# Patient Record
Sex: Female | Born: 1990 | Race: Black or African American | Hispanic: No | Marital: Married | Smoking: Never smoker
Health system: Southern US, Community
[De-identification: ages and names within clinical notes are randomized; demographics above are authoritative.]

## PROBLEM LIST (undated history)

## (undated) DIAGNOSIS — B951 Streptococcus, group B, as the cause of diseases classified elsewhere: Secondary | ICD-10-CM

## (undated) HISTORY — PX: BREAST BIOPSY: SHX20

---

## 2015-11-06 DIAGNOSIS — D242 Benign neoplasm of left breast: Secondary | ICD-10-CM

## 2015-11-06 HISTORY — DX: Benign neoplasm of left breast: D24.2

## 2015-11-29 ENCOUNTER — Other Ambulatory Visit: Payer: Self-pay | Admitting: Obstetrics and Gynecology

## 2015-11-29 DIAGNOSIS — N63 Unspecified lump in unspecified breast: Secondary | ICD-10-CM

## 2015-12-02 ENCOUNTER — Other Ambulatory Visit: Payer: Self-pay | Admitting: Obstetrics and Gynecology

## 2015-12-02 DIAGNOSIS — N63 Unspecified lump in unspecified breast: Secondary | ICD-10-CM

## 2015-12-05 ENCOUNTER — Ambulatory Visit
Admission: RE | Admit: 2015-12-05 | Discharge: 2015-12-05 | Disposition: A | Discharge status: Home / Self Care | Payer: 59 | Source: Ambulatory Visit | Attending: Obstetrics and Gynecology | Admitting: Obstetrics and Gynecology

## 2015-12-05 DIAGNOSIS — N63 Unspecified lump in unspecified breast: Secondary | ICD-10-CM

## 2016-07-08 ENCOUNTER — Other Ambulatory Visit: Payer: Self-pay | Admitting: Obstetrics and Gynecology

## 2016-07-08 DIAGNOSIS — N63 Unspecified lump in unspecified breast: Secondary | ICD-10-CM

## 2016-08-04 ENCOUNTER — Ambulatory Visit: Payer: 59

## 2016-08-04 ENCOUNTER — Other Ambulatory Visit: Payer: 59

## 2016-10-21 ENCOUNTER — Encounter: Payer: Self-pay | Admitting: Obstetrics and Gynecology

## 2016-11-10 ENCOUNTER — Telehealth: Payer: Self-pay

## 2016-11-10 NOTE — Telephone Encounter (Signed)
Lm with pt regarding breast follow up per [facility]. Please let me know when she calls back

## 2016-11-10 NOTE — Telephone Encounter (Signed)
-----   Message from Conard NovakStephen D Jackson, MD sent at 11/10/2016  9:45 AM EST ----- Regarding: [place], This is a [place] that needed a [place].  [place].  But, I have not been in touch with her.  Would you mind calling this patient and making sure she has had a breast biopsy or at least has had one arranged?  Thank you

## 2016-11-24 ENCOUNTER — Encounter (INDEPENDENT_AMBULATORY_CARE_PROVIDER_SITE_OTHER): Payer: Self-pay | Admitting: Family Medicine

## 2016-11-24 ENCOUNTER — Encounter (INDEPENDENT_AMBULATORY_CARE_PROVIDER_SITE_OTHER): Payer: Self-pay

## 2016-11-24 ENCOUNTER — Ambulatory Visit (INDEPENDENT_AMBULATORY_CARE_PROVIDER_SITE_OTHER): Payer: No Typology Code available for payment source | Admitting: Family Medicine

## 2016-11-24 VITALS — BP 113/69 | HR 77 | Temp 97.8°F | Ht 66.75 in | Wt 130.6 lb

## 2016-11-24 DIAGNOSIS — R110 Nausea: Secondary | ICD-10-CM

## 2016-11-24 DIAGNOSIS — R42 Dizziness and giddiness: Secondary | ICD-10-CM

## 2016-11-24 DIAGNOSIS — N63 Unspecified lump in unspecified breast: Secondary | ICD-10-CM

## 2016-11-24 NOTE — Progress Notes (Signed)
Subjective:      Patient ID: [name] is a 26 y.o. female.    Chief Complaint:  Chief Complaint   Patient presents with   . Breast Mass     order for biopsy   . Dizziness     c/o started last week with some nausea.        HPI:  HPI     Last Feb she had an annual check up with breast exam, pap. Found to be abn. Was referred for US breast. Was told to f/u q6 months. That was in [place]. She had a mammo f/u in Feb, noticed L breast lump was enlarged. Was told to have biopsy.     She has some intermittent dizziness siunce last week.It only last for a few seconds.  Last time she had it was in October or November.  When she lay down. Unsure if ear infection. Sometimes happens after she is sitting for some time. She was on a Flight last week and heard a pop in her ears with some pain. No congestion or allergies. Not positional. Sometimes has 12 oz caffeine, sometimes none. No syncope. No CP or palpitations. No dyspnea on exertion. She is not too active. Does not drink a lot of water a day.     Sometimes has nausea with exercise. This has not always been the case.  When she was exercising more regularly, she did not have this problem until one day came on suddenly.  Due to this, she has been avoiding exercise.  She has tried to eat before and after exercise and has not found good relief with either method. Periods are regular.  She does not describe a feeling of bloating.  There is no family history of gynecologic cancers.      Problem List:  There is no problem list on file for this patient.      Current Medications:  No current outpatient prescriptions on file.     No current facility-administered medications for this visit.        Allergies:  No Known Allergies    Past Medical History:  History reviewed. No pertinent past medical history.    Past Surgical History:  History reviewed. No pertinent surgical history.    Family History:  Family History   Problem Relation Age of Onset   . No known problems Mother    .  Cancer Father      lung cancer, smoker   . No known problems Sister    . No known problems Brother    . No known problems Sister    . No known problems Sister    . No known problems Sister        Social History:  Social History     Social History   . Marital status: Single     Spouse name: N/A   . Number of children: N/A   . Years of education: N/A     Occupational History   . auditor      Social History Main Topics   . Smoking status: Never Smoker   . Smokeless tobacco: Never Used   . Alcohol use No   . Drug use: No   . Sexual activity: Yes     Partners: Male     Other Topics Concern   . Not on file     Social History Narrative   . No narrative on file       The following sections  were reviewed this encounter by the provider:   Tobacco  Allergies  Meds  Problems  Med Hx  Surg Hx  Fam Hx  Soc Hx          ROS:  Review of Systems   No changes in bowel habits or urinary problems.     Vitals:  BP 113/69 (BP Site: Left arm, Patient Position: Sitting, Cuff Size: Medium)   Pulse 77   Temp 97.8 F (36.6 C) (Oral)   Ht 1.695 m (5' 6.75")   Wt 59.2 kg (130 lb 9.6 oz)   LMP 10/22/2016 (Exact Date)   SpO2 98%   BMI 20.61 kg/m      Objective:     Physical Exam:  Physical Exam   Constitutional: She is oriented to person, place, and time. She appears well-developed and well-nourished.   HENT:   Head: Normocephalic and atraumatic.   Right Ear: Hearing, tympanic membrane, external ear and ear canal normal.   Left Ear: Hearing, tympanic membrane, external ear and ear canal normal.   Mouth/Throat: Oropharynx is clear and moist. No oropharyngeal exudate.   Cardiovascular: Normal rate and regular rhythm.  Exam reveals no friction rub.    No murmur heard.  Pulmonary/Chest: Effort normal and breath sounds normal. No respiratory distress. She has no wheezes.   Neurological: She is alert and oriented to person, place, and time.   Negative Dix-Hallpike.   Skin: Skin is warm and dry.   Psychiatric: She has a normal mood and  affect. Her behavior is normal.   Nursing note and vitals reviewed.         Labs:  No results found for: WBC, HGB, HCT, PLT, CHOL, TRIG, HDL, LDL, ALT, AST, NA, K, CL, CREAT, BUN, CO2, TSH, PSA, INR, GLU, HGBA1C       Assessment:     1. Breast mass  - Breast Surgery Referral: Anner Crete, MD [facility])    2. Dizziness    3. Nausea      Plan:     Patient Instructions   Talked about many things today.    1.  We talked about the referral to the breast surgeon.  If you decides that she would like to see a doctor in [place], because this is closer to your workplace, let me know and I will give you a generic referral, that you can take anywhere.    2.  We talked about your nausea with exercise.  This might be due to dehydration or deconditioning.  Keep an eye on this and let me know if you noticed any other changes.  We also discussed if you were having some increased bloating, we may consider sending you for a pelvic ultrasound to make sure that your reproductive system is normal.    3.  We talked about your dizziness.  It does not seem that this is very consistent frequent or long lasting.  I do not know exactly what caused your brief episode of dizziness in the past, but if you notice that it is becoming more troublesome, then please see if you can find any triggers and return for follow-up appointment.      Terressa Koyanagi, MD

## 2016-11-24 NOTE — Patient Instructions (Signed)
Talked about many things today.    1.  We talked about the referral to the breast surgeon.  If you decides that she would like to see a doctor in [place], because this is closer to your workplace, let me know and I will give you a generic referral, that you can take anywhere.    2.  We talked about your nausea with exercise.  This might be due to dehydration or deconditioning.  Keep an eye on this and let me know if you noticed any other changes.  We also discussed if you were having some increased bloating, we may consider sending you for a pelvic ultrasound to make sure that your reproductive system is normal.    3.  We talked about your dizziness.  It does not seem that this is very consistent frequent or long lasting.  I do not know exactly what caused your brief episode of dizziness in the past, but if you notice that it is becoming more troublesome, then please see if you can find any triggers and return for follow-up appointment.

## 2016-11-24 NOTE — Progress Notes (Signed)
Have you seen any specialists/other providers since your last visit with us?    No      Limb alert protocol reviewed?      Yes

## 2016-11-30 NOTE — Telephone Encounter (Signed)
Fax received From [facility] dated 11/25/16. They are requesting us to provide them with a copy of the breast biopsy Pathology report for this patient.

## 2016-11-30 NOTE — Telephone Encounter (Signed)
Trying to call pt again regarding this. Cannot get in touch with her. Please let me know when she calls back or find out if she has had the biopsy or if she has it scheduled and let me know please. Thank you

## 2016-12-11 NOTE — Telephone Encounter (Signed)
Yes. Please send patient a letter letting her know we are trying to contact her and have been unsuccessful.  We want her to know that it is important to follow up with all breast-related testing we have been doing.  If she has been doing this, we would appreciate a phone call just to let us know.  Please let me know, if you have questions.

## 2016-12-17 NOTE — Telephone Encounter (Signed)
We do not have a new address for the patient. Lmtrc.

## 2016-12-18 NOTE — Telephone Encounter (Signed)
I was able to reach the patient. She hasn't had a biopsy yet, but plans to schedule it within the next couple of weeks. She doesn't plan to have the biopsy at [facility], but said she has a primary care provider in the area and will have that provider place orders for the biopsy. I repeated that information back to the patient, and then confirmed with the patient that she did not need anything further from Dr Jean Rosenthal, and she said that was correct.

## 2016-12-22 ENCOUNTER — Encounter: Payer: Self-pay | Admitting: Obstetrics and Gynecology

## 2017-01-10 IMAGING — US US BREAST*R* LIMITED INC AXILLA
2 series · 11 of 11 positions shown · non-contrast
Comparison: None.

CLINICAL DATA: 24-year-old female who has noticed a nodule in the
upper, outer right breast and lateral left breast for 2 weeks.

EXAM:
ULTRASOUND OF THE BILATERAL BREAST

[Series 2: us breast*right* limited inc axilla · 0.05mm/px · 6 of 6 slices shown (1 of 2)]
[im 1/6]
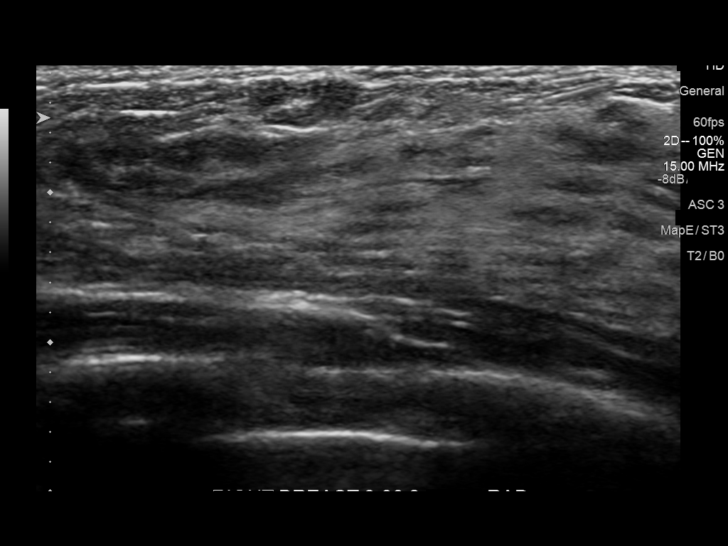
[im 2/6]
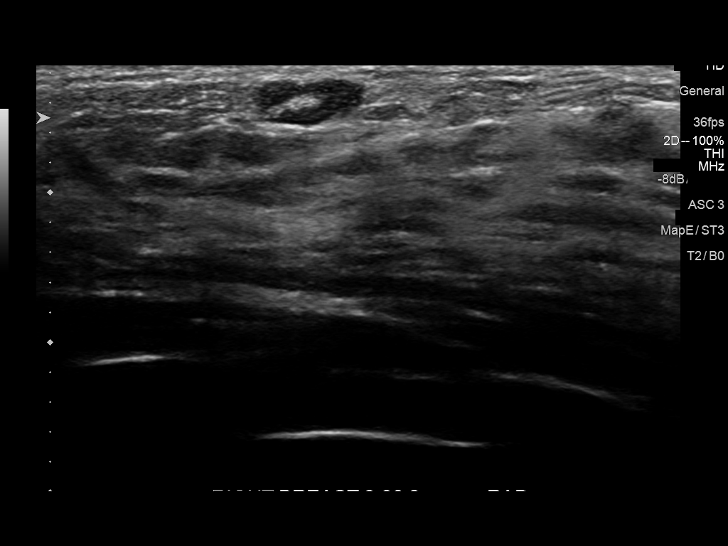
[im 3/6]
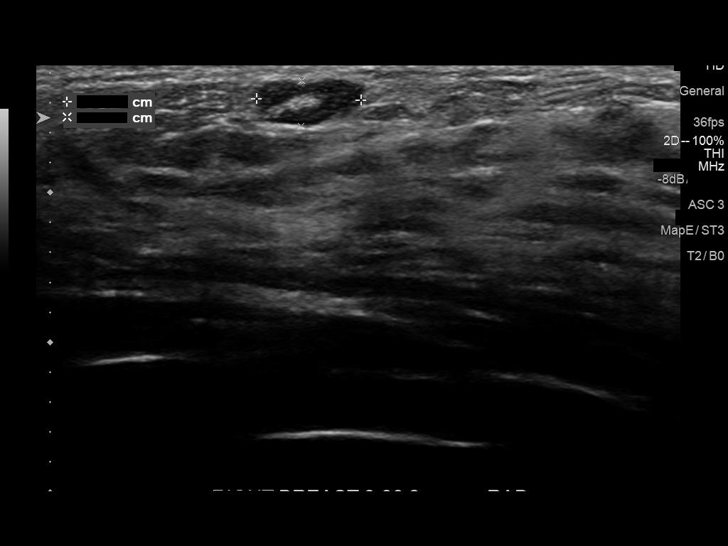
[im 4/6]
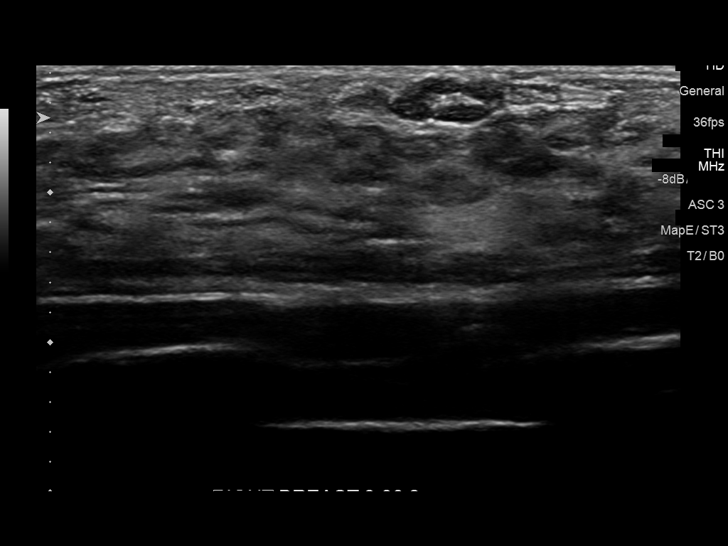
[im 5/6]
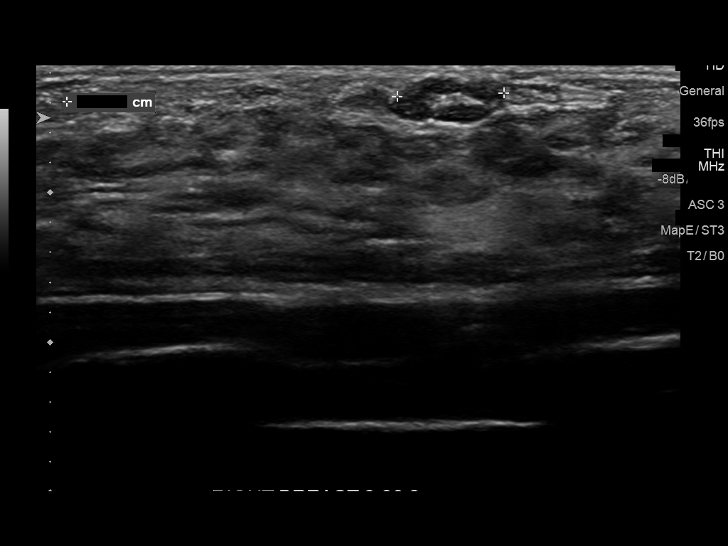
[im 6/6]
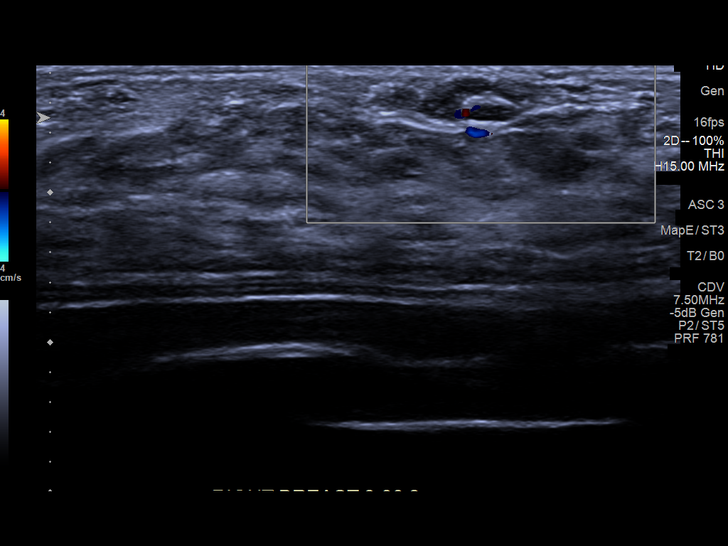

[Series 4: us breast*right* limited inc axilla · 0.02mm/px · 5 of 5 slices shown (2 of 2)]
[im 1/5]
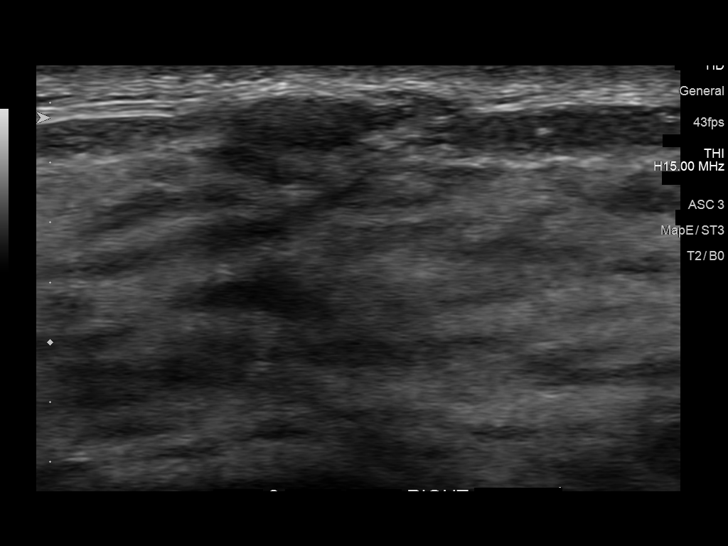
[im 2/5]
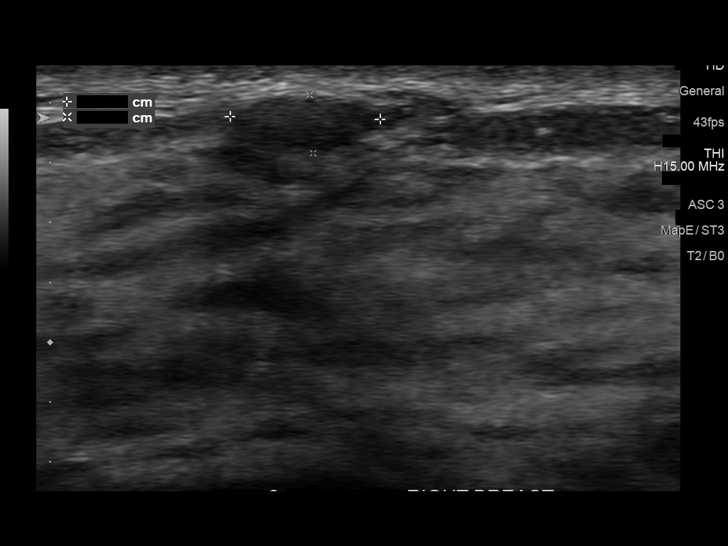
[im 3/5]
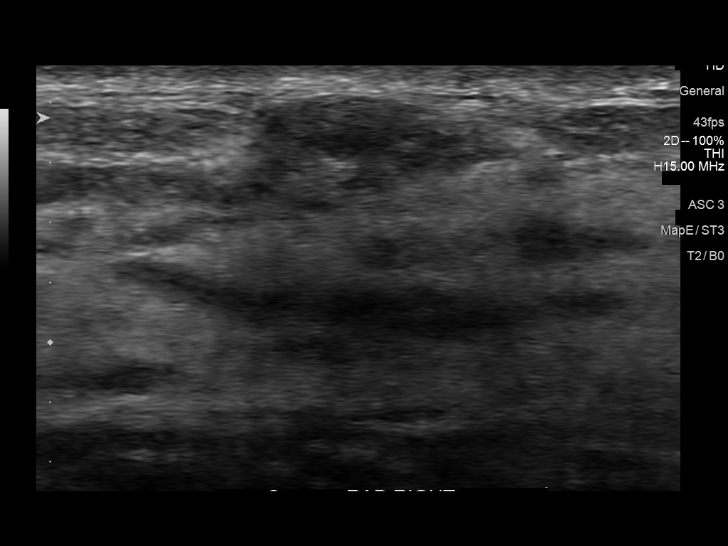
[im 4/5]
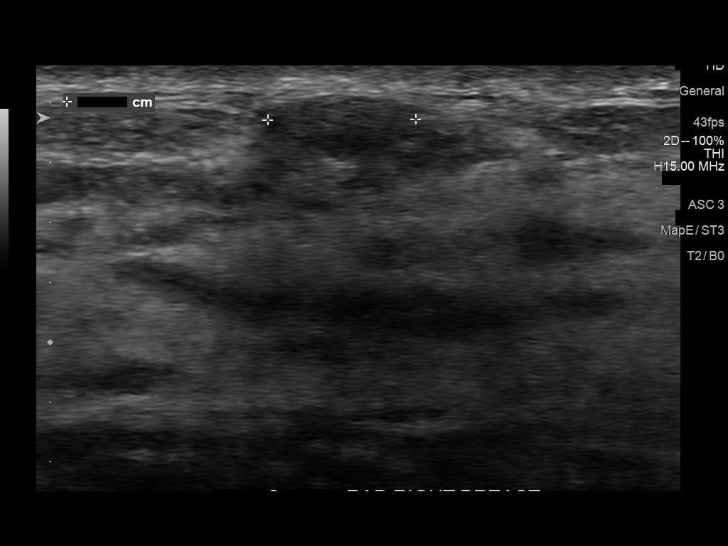
[im 5/5]
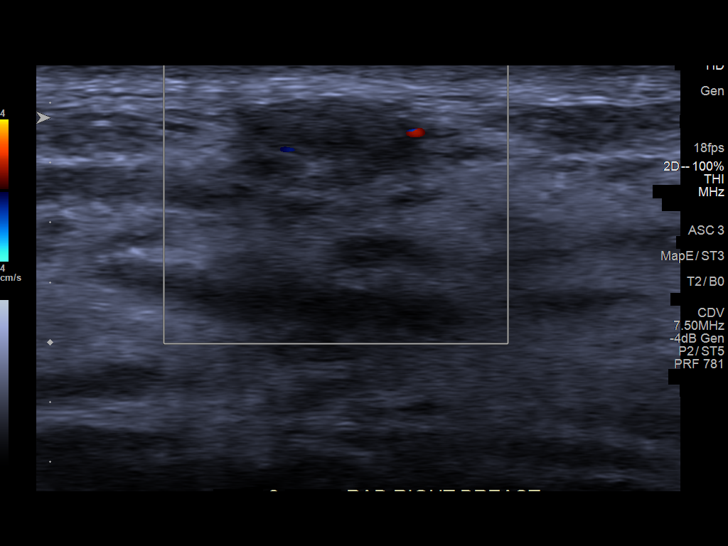

[11 of 11 positions shown; findings below may reference images not displayed]

FINDINGS: On physical exam of the left breast, there is an approximately 1 cm
palpable nodule at [DATE], 5 cm from the nipple, corresponding to the
patient's area of concern. On physical exam of the right breast,
there is a 7 mm nodule at 9 o'clock, 3 cm from the nipple,
corresponding to the patient's area of concern. An additional
smaller nodule is felt at 11 o'clock, 2 cm from the nipple.

Targeted ultrasound of the left breast is performed, showing an
oval, circumscribed, hypoechoic mass at [DATE], 5 cm from the nipple
measuring 13 x 7 x 11 mm, corresponding to the palpable abnormality.
A similar-appearing oval, circumscribed, hypoechoic mass is
incidentally noted at [DATE], 1 cm from the nipple measuring 15 x 4 x
14 mm.

Targeted ultrasound of the right breast demonstrates a normal
intramammary lymph node at 9 o'clock, 3 cm from the nipple measuring
7 x 7 x 3 mm, corresponding to the palpable abnormality. At 11
o'clock, 2 cm, there is an oval, circumscribed, hypoechoic mass
measuring 5 x 5 x 2 mm, corresponding to the second palpable
abnormality in the right breast.
IMPRESSION: Bilateral probably benign masses, likely fibroadenomas.

RECOMMENDATION:
Options including short-term follow-up versus ultrasound-guided
biopsy were discussed with the patient. The patient wishes to pursue
short-term follow-up at this time. Bilateral ultrasound in 6 months.

I have discussed the findings and recommendations with the patient.
Results were also provided in writing at the conclusion of the
visit. If applicable, a reminder letter will be sent to the patient
regarding the next appointment.

BI-RADS CATEGORY  3: Probably benign finding(s) - short interval
follow-up suggested.

## 2017-01-10 IMAGING — US US BREAST*L* LIMITED INC AXILLA
1 series · 12 of 12 positions shown · non-contrast
Comparison: None.

CLINICAL DATA: 24-year-old female who has noticed a nodule in the
upper, outer right breast and lateral left breast for 2 weeks.

EXAM:
ULTRASOUND OF THE BILATERAL BREAST

[Series 1: us breast*left* limited inc axilla · 0.05mm/px · 12 of 12 slices shown]
[im 1/12]
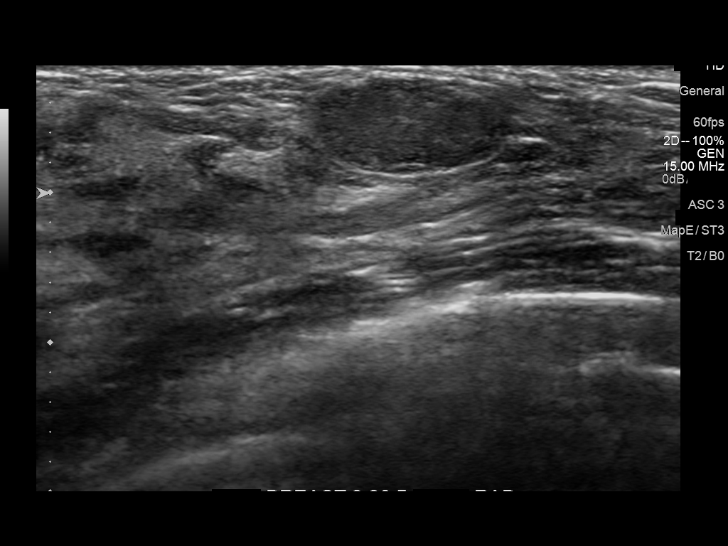
[im 2/12]
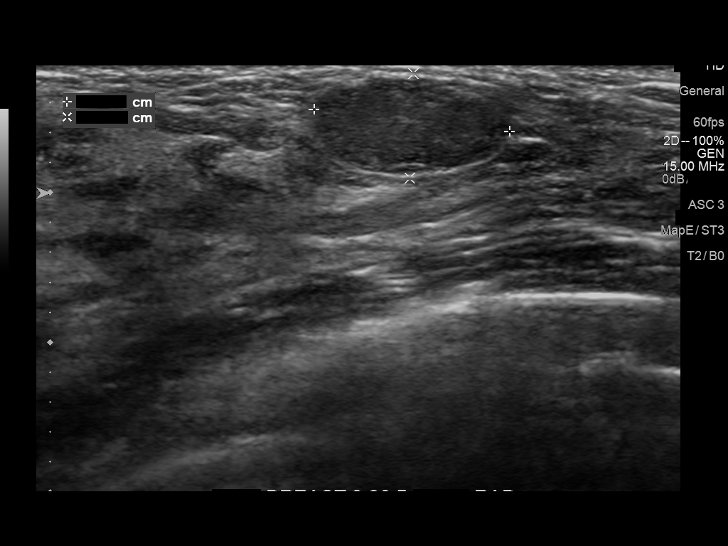
[im 3/12]
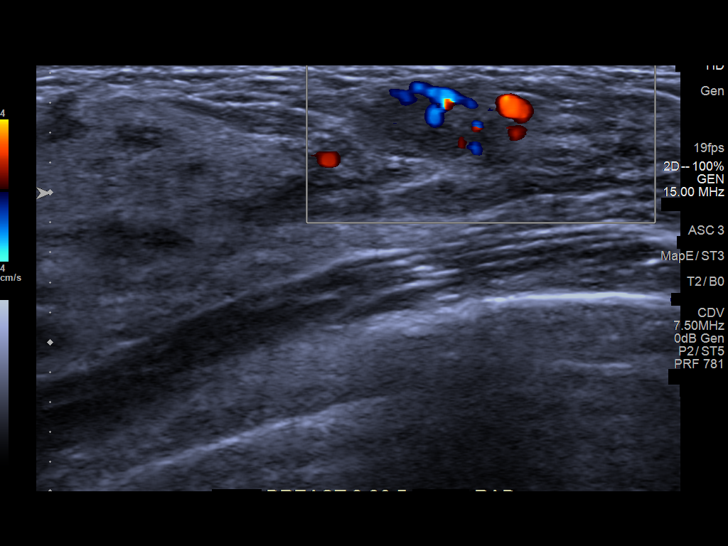
[im 4/12]
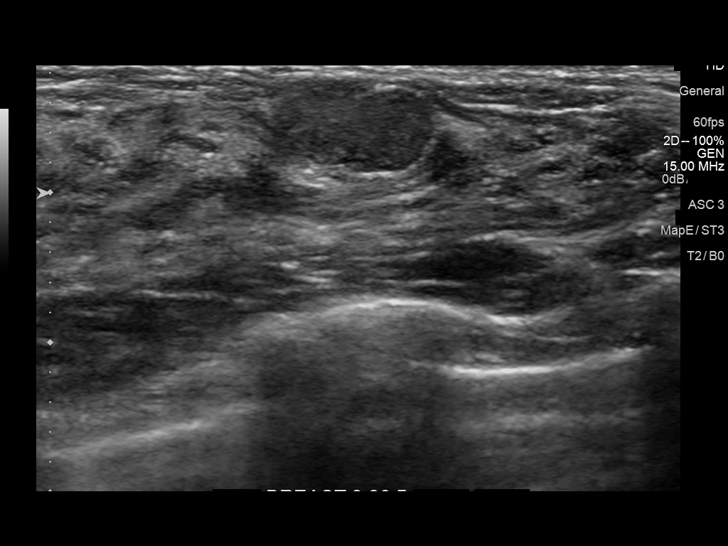
[im 5/12]
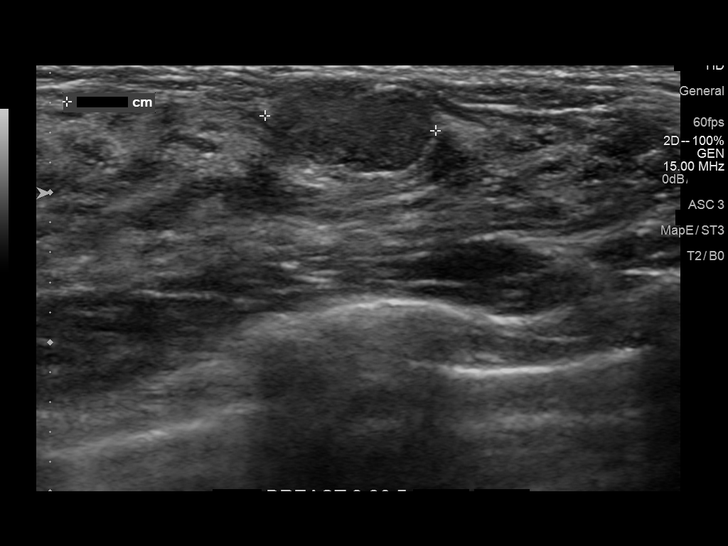
[im 6/12]
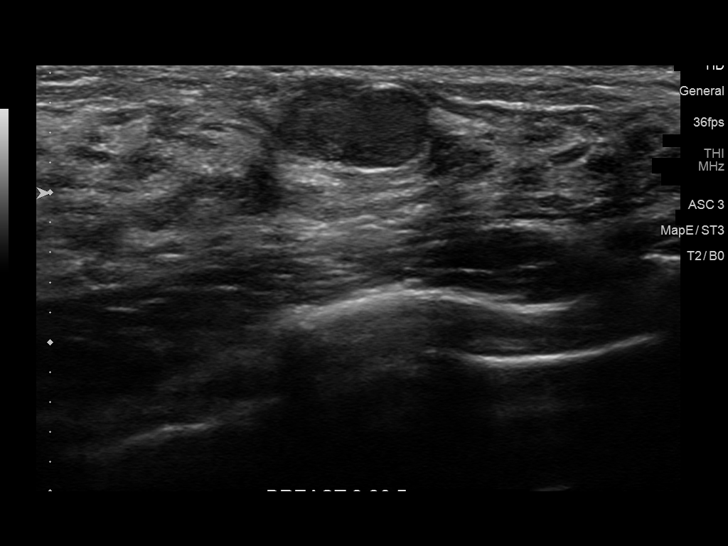
[im 7/12]
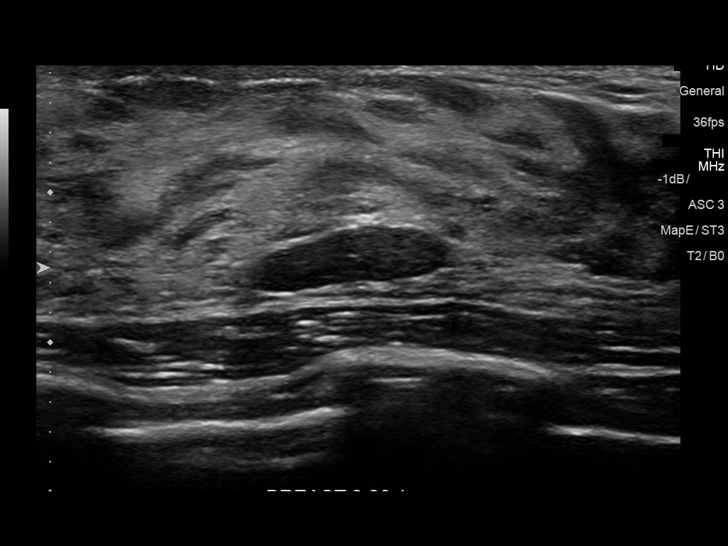
[im 8/12]
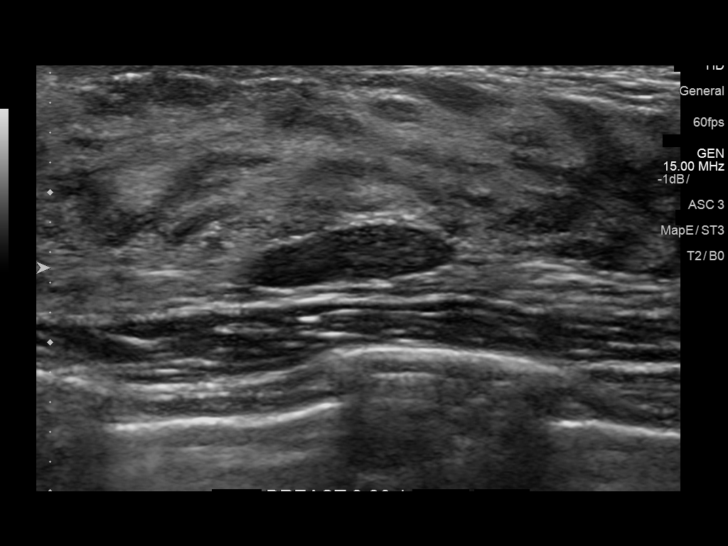
[im 9/12]
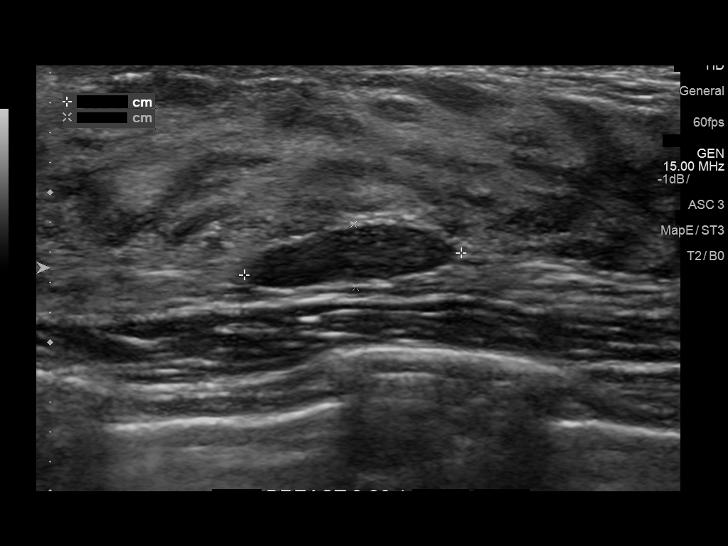
[im 10/12]
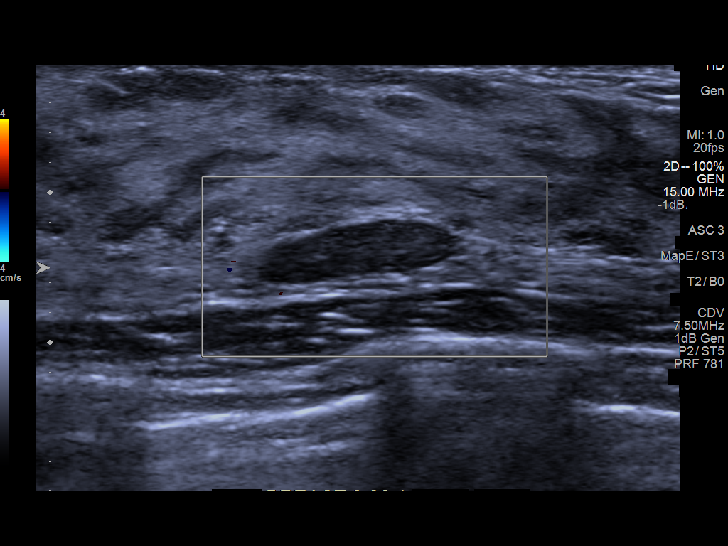
[im 11/12]
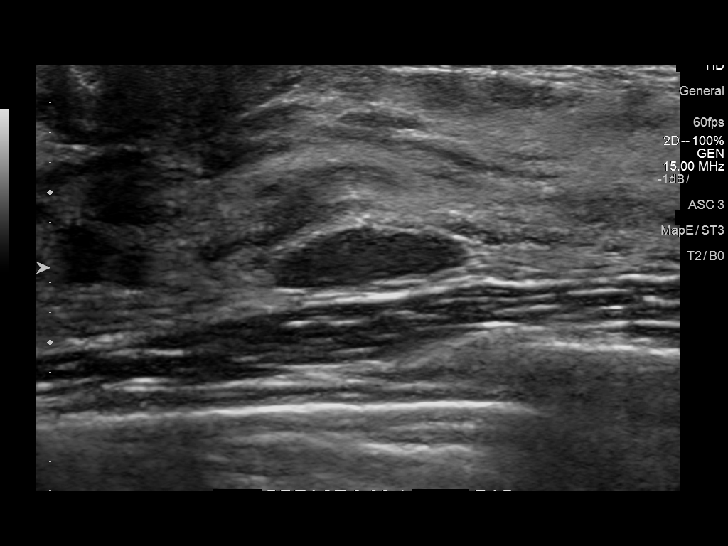
[im 12/12]
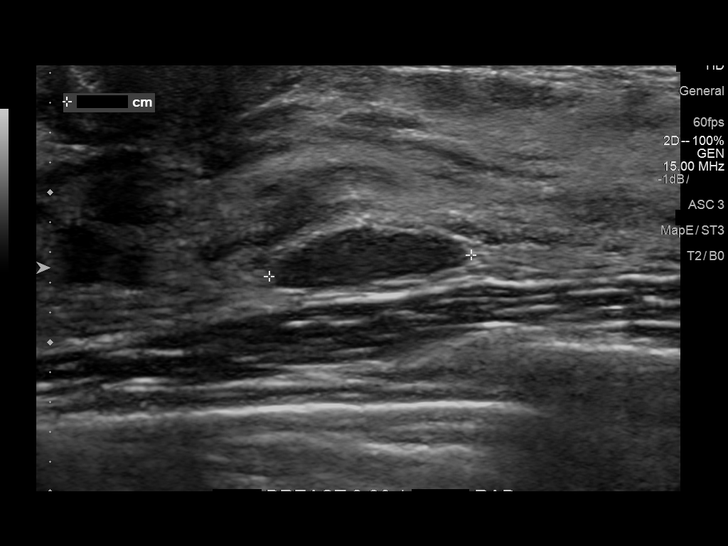

[12 of 12 positions shown; findings below may reference images not displayed]

FINDINGS: On physical exam of the left breast, there is an approximately 1 cm
palpable nodule at [DATE], 5 cm from the nipple, corresponding to the
patient's area of concern. On physical exam of the right breast,
there is a 7 mm nodule at 9 o'clock, 3 cm from the nipple,
corresponding to the patient's area of concern. An additional
smaller nodule is felt at 11 o'clock, 2 cm from the nipple.

Targeted ultrasound of the left breast is performed, showing an
oval, circumscribed, hypoechoic mass at [DATE], 5 cm from the nipple
measuring 13 x 7 x 11 mm, corresponding to the palpable abnormality.
A similar-appearing oval, circumscribed, hypoechoic mass is
incidentally noted at [DATE], 1 cm from the nipple measuring 15 x 4 x
14 mm.

Targeted ultrasound of the right breast demonstrates a normal
intramammary lymph node at 9 o'clock, 3 cm from the nipple measuring
7 x 7 x 3 mm, corresponding to the palpable abnormality. At 11
o'clock, 2 cm, there is an oval, circumscribed, hypoechoic mass
measuring 5 x 5 x 2 mm, corresponding to the second palpable
abnormality in the right breast.
IMPRESSION: Bilateral probably benign masses, likely fibroadenomas.

RECOMMENDATION:
Options including short-term follow-up versus ultrasound-guided
biopsy were discussed with the patient. The patient wishes to pursue
short-term follow-up at this time. Bilateral ultrasound in 6 months.

I have discussed the findings and recommendations with the patient.
Results were also provided in writing at the conclusion of the
visit. If applicable, a reminder letter will be sent to the patient
regarding the next appointment.

BI-RADS CATEGORY  3: Probably benign finding(s) - short interval
follow-up suggested.

## 2017-01-13 ENCOUNTER — Other Ambulatory Visit (INDEPENDENT_AMBULATORY_CARE_PROVIDER_SITE_OTHER): Payer: Self-pay | Admitting: Family Medicine

## 2017-01-13 ENCOUNTER — Encounter (INDEPENDENT_AMBULATORY_CARE_PROVIDER_SITE_OTHER): Payer: Self-pay

## 2017-01-13 ENCOUNTER — Telehealth (INDEPENDENT_AMBULATORY_CARE_PROVIDER_SITE_OTHER): Payer: Self-pay

## 2017-01-13 ENCOUNTER — Encounter (INDEPENDENT_AMBULATORY_CARE_PROVIDER_SITE_OTHER): Payer: Self-pay | Admitting: Family Medicine

## 2017-01-13 DIAGNOSIS — N6320 Unspecified lump in the left breast, unspecified quadrant: Secondary | ICD-10-CM

## 2017-01-13 NOTE — Telephone Encounter (Signed)
Faxed to [facility] order for Bx at [phone]

## 2017-01-22 ENCOUNTER — Encounter (INDEPENDENT_AMBULATORY_CARE_PROVIDER_SITE_OTHER): Payer: Self-pay

## 2017-01-25 ENCOUNTER — Encounter (INDEPENDENT_AMBULATORY_CARE_PROVIDER_SITE_OTHER): Payer: Self-pay | Admitting: Family Medicine

## 2017-01-29 NOTE — Progress Notes (Signed)
Subjective:       Patient ID: [name] is a 26 y.o. female.    HPI  10/21/16 Bilateral breast ultrasound shows the previous mass in the left breast 2:30, has increased in size to 2.4 x 1.3 x 1.9 initially measured 1.3 x 0.7 x 1.1  01/18/17 left ultrasound-guided core biopsy revealed a fibroadenoma    Discussed that this is large enough that we would recommend removing it.  We reviewed how the surgery would be performed.  She would have a scar, but not a significant deformity.    The following portions of the patient's history were reviewed and updated as appropriate: allergies, current medications, past family history, past medical history, past social history, past surgical history and problem list.    Review of Systems  ROS:  Constitutional:  negative  HEENT:  negative  Cardiovascular:  negative  Respiratory:  negative  Gastrointestinal:  negative  Genitourinary:  negative  Musculoskeletal:  negative  Skin:  negative  Neurologic:  negative  Psychiatric:  negative  Endocrine:  negative  Hematologic:  negative  Other:  All other systems negative                                Objective:    Physical Exam  WDWN female in NAD  HEENT:  Clear, no scleral icterus, neck supple  Neck:  No thyromegaly or masses, trachea midline  Chest:  Clear to auscultation, respiratory effort normal  CV:  RRR without murmurs or gallops, no pedal edema  Abd:  No tenderness, organomegaly or masses  BJM:  Gait and station normal  Ext:  No CCE  Skin:  Free of significant ulcers or lesions  Neuro:  Grossly intact, no focal findings, alert and oriented, asks appropriate questions  LN:  No axillary, supraclavicular or cervical adenopathy  Breast:  She is examined in the upright and supine position. Both nipples are everted. There is no nipple discharge. There are no discrete masses bilaterally.  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  The localization procedure is performed the day of surgery by the radiologist, in the radiology suite.  With the wire in place, she is then transferred to the operating room and given local anesthesia that is generally combined with intravenous sedation.  The area of concern is removed with the wire embedded in the tissue.  While she and I remain in the operating room, the specimen is sent to the radiologist for x-ray confirmation that the correct area has been removed.  The tissue is then sent to the pathologist.  We will have the final diagnosis five to seven days later, at which time she will have a post-operative visit with me. We will discuss the pathology report in person.  I do not give pathology results by phone.  I reassured [name] that although she will have a scar that needs to be large enough to remove the lump, there should be no significant change in the contour of the breast.  The risks of this procedure include, but are not limited to, infection, bleeding, wound-healing problems including hematoma, seroma, and scar formation as well as the possibility of an adverse reaction  to any of the medications administered.  She was advised of the postoperative care which includes application of ice, a prescription for pain medication, and the recommendation to wear a bra, even at night.  She should curtail strenuous activities for 48 to 72 hours and should be able to return to usual activities within a week.    The patient will consider this.  She is not sure she wants to have surgery.  She does not want to have a scar.  If she decides against surgery.  I given her an order to repeat the ultrasound in 6 months but she understands this is not our recommendation.

## 2017-02-02 ENCOUNTER — Ambulatory Visit (INDEPENDENT_AMBULATORY_CARE_PROVIDER_SITE_OTHER): Payer: No Typology Code available for payment source | Admitting: Surgery

## 2017-02-02 ENCOUNTER — Encounter (INDEPENDENT_AMBULATORY_CARE_PROVIDER_SITE_OTHER): Payer: Self-pay | Admitting: Surgery

## 2017-02-02 VITALS — BP 107/63 | HR 64 | Temp 97.2°F | Ht 66.0 in | Wt 130.2 lb

## 2017-02-02 DIAGNOSIS — D242 Benign neoplasm of left breast: Secondary | ICD-10-CM

## 2017-02-12 ENCOUNTER — Telehealth: Payer: No Typology Code available for payment source

## 2017-02-12 ENCOUNTER — Encounter (HOSPITAL_BASED_OUTPATIENT_CLINIC_OR_DEPARTMENT_OTHER): Payer: Self-pay

## 2017-02-12 NOTE — Pre-Procedure Instructions (Signed)
   No testing per anesthesia guidelines.    02/02/17- Sx H&P on chart.

## 2017-02-23 NOTE — Progress Notes (Addendum)
Subjective:       Patient ID: [name] is a 26 y.o. female.    HPI  10/21/16 Bilateral breast ultrasound shows the previous mass in the left breast 2:30, has increased in size to 2.4 x 1.3 x 1.9 initially measured 1.3 x 0.7 x 1.1  01/18/17 left ultrasound-guided core biopsy revealed a fibroadenoma    Discussed that this is large enough that we would recommend removing it.  We reviewed how the surgery would be performed.  She would have a scar, but not a significant deformity.    The following portions of the patient's history were reviewed and updated as appropriate: allergies, current medications, past family history, past medical history, past social history, past surgical history and problem list.    Review of Systems  ROS:  Constitutional:  negative  HEENT:  negative  Cardiovascular:  negative  Respiratory:  negative  Gastrointestinal:  negative  Genitourinary:  negative  Musculoskeletal:  negative  Skin:  negative  Neurologic:  negative  Psychiatric:  negative  Endocrine:  negative  Hematologic:  negative  Other:  All other systems negative                                Objective:    Physical Exam  WDWN female in NAD  HEENT:  Clear, no scleral icterus, neck supple  Neck:  No thyromegaly or masses, trachea midline  Chest:  Clear to auscultation, respiratory effort normal  CV:  RRR without murmurs or gallops, no pedal edema  Abd:  No tenderness, organomegaly or masses  BJM:  Gait and station normal  Ext:  No CCE  Skin:  Free of significant ulcers or lesions  Neuro:  Grossly intact, no focal findings, alert and oriented, asks appropriate questions  LN:  No axillary, supraclavicular or cervical adenopathy  Breast:  She is examined in the upright and supine position. Both nipples are everted. There is no nipple discharge. There are no discrete masses bilaterally.  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  The localization procedure is performed the day of surgery by the radiologist, in the radiology suite.  With the wire in place, she is then transferred to the operating room and given local anesthesia that is generally combined with intravenous sedation.  The area of concern is removed with the wire embedded in the tissue.  While she and I remain in the operating room, the specimen is sent to the radiologist for x-ray confirmation that the correct area has been removed.  The tissue is then sent to the pathologist.  We will have the final diagnosis five to seven days later, at which time she will have a post-operative visit with me. We will discuss the pathology report in person.  I do not give pathology results by phone.  I reassured Monesha Monreal that although she will have a scar that needs to be large enough to remove the lump, there should be no significant change in the contour of the breast.  The risks of this procedure include, but are not limited to, infection, bleeding, wound-healing problems including hematoma, seroma, and scar formation as well as the possibility of an adverse reaction  to any of the medications administered.  She was advised of the postoperative care which includes application of ice, a prescription for pain medication, and the recommendation to wear a bra, even at night.  She should curtail strenuous activities for 48 to 72 hours and should be able to return to usual activities within a week.    The patient will consider this.  She is not sure she wants to have surgery.  She does not want to have a scar.  If she decides against surgery.  I given her an order to repeat the ultrasound in 6 months but she understands this is not our recommendation.    No change to PE or plan

## 2017-02-24 ENCOUNTER — Other Ambulatory Visit: Payer: Self-pay

## 2017-02-24 ENCOUNTER — Encounter (HOSPITAL_BASED_OUTPATIENT_CLINIC_OR_DEPARTMENT_OTHER): Payer: Self-pay

## 2017-02-24 ENCOUNTER — Ambulatory Visit: Payer: No Typology Code available for payment source

## 2017-02-24 ENCOUNTER — Ambulatory Visit (HOSPITAL_BASED_OUTPATIENT_CLINIC_OR_DEPARTMENT_OTHER): Payer: No Typology Code available for payment source | Admitting: Anesthesiology

## 2017-02-24 ENCOUNTER — Ambulatory Visit: Payer: Self-pay

## 2017-02-24 ENCOUNTER — Encounter (HOSPITAL_BASED_OUTPATIENT_CLINIC_OR_DEPARTMENT_OTHER)
Admission: RE | Disposition: A | Discharge status: Home / Self Care | Payer: Self-pay | Source: Ambulatory Visit | Attending: Surgery

## 2017-02-24 ENCOUNTER — Ambulatory Visit
Admission: RE | Admit: 2017-02-24 | Discharge: 2017-02-24 | Disposition: A | Discharge status: Home / Self Care | Payer: No Typology Code available for payment source | Source: Ambulatory Visit | Attending: Surgery | Admitting: Surgery

## 2017-02-24 DIAGNOSIS — D242 Benign neoplasm of left breast: Secondary | ICD-10-CM

## 2017-02-24 LAB — POCT PREGNANCY TEST, URINE HCG: POCT Pregnancy HCG Test, UR: NEGATIVE

## 2017-02-24 SURGERY — BIOPSY, BREAST, WITH NEEDLE LOCALIZATION, WITH ULTRASOUND (US) GUIDANCE
Anesthesia: Anesthesia General | Site: Breast | Laterality: Left | Wound class: Clean

## 2017-02-24 MED ORDER — LIDOCAINE HCL 2 % IJ SOLN
INTRAMUSCULAR | Status: DC | PRN
Start: 2017-02-24 — End: 2017-02-24
  Administered 2017-02-24: 60 mg

## 2017-02-24 MED ORDER — CEFAZOLIN SODIUM-DEXTROSE 2-3 GM-% IV SOLR
2.0000 g | Freq: Once | INTRAVENOUS | Status: AC
Start: 2017-02-24 — End: 2017-02-24
  Administered 2017-02-24: 09:00:00 2 g via INTRAVENOUS

## 2017-02-24 MED ORDER — PROPOFOL INFUSION 10 MG/ML
INTRAVENOUS | Status: DC | PRN
Start: 2017-02-24 — End: 2017-02-24
  Administered 2017-02-24: 100 ug/kg/min via INTRAVENOUS

## 2017-02-24 MED ORDER — LIDOCAINE HCL (PF) 2 % IJ SOLN
INTRAMUSCULAR | Status: AC
Start: 2017-02-24 — End: ?
  Filled 2017-02-24: qty 5

## 2017-02-24 MED ORDER — OXYCODONE HCL 5 MG PO TABS
5.0000 mg | ORAL_TABLET | Freq: Once | ORAL | Status: DC | PRN
Start: 2017-02-24 — End: 2017-02-24
  Filled 2017-02-24: qty 1

## 2017-02-24 MED ORDER — ACETAMINOPHEN 500 MG PO TABS
ORAL_TABLET | ORAL | Status: AC
Start: 2017-02-24 — End: ?
  Filled 2017-02-24: qty 2

## 2017-02-24 MED ORDER — MIDAZOLAM HCL 2 MG/2ML IJ SOLN
INTRAMUSCULAR | Status: DC | PRN
Start: 2017-02-24 — End: 2017-02-24
  Administered 2017-02-24: 2 mg via INTRAVENOUS

## 2017-02-24 MED ORDER — FENTANYL CITRATE (PF) 50 MCG/ML IJ SOLN (WRAP)
25.0000 ug | INTRAMUSCULAR | Status: DC | PRN
Start: 2017-02-24 — End: 2017-02-24
  Filled 2017-02-24: qty 2

## 2017-02-24 MED ORDER — PROPOFOL 10 MG/ML IV EMUL (WRAP)
INTRAVENOUS | Status: AC
Start: 2017-02-24 — End: ?
  Filled 2017-02-24: qty 50

## 2017-02-24 MED ORDER — GLYCOPYRROLATE 0.2 MG/ML IJ SOLN
INTRAMUSCULAR | Status: DC | PRN
Start: 2017-02-24 — End: 2017-02-24
  Administered 2017-02-24 (×2): 0.1 mg via INTRAVENOUS

## 2017-02-24 MED ORDER — FENTANYL CITRATE (PF) 50 MCG/ML IJ SOLN (WRAP)
INTRAMUSCULAR | Status: DC | PRN
Start: 2017-02-24 — End: 2017-02-24
  Administered 2017-02-24 (×2): 25 ug via INTRAVENOUS

## 2017-02-24 MED ORDER — ACETAMINOPHEN 500 MG PO TABS
1000.0000 mg | ORAL_TABLET | Freq: Once | ORAL | Status: AC
Start: 2017-02-24 — End: 2017-02-24
  Administered 2017-02-24: 08:00:00 1000 mg via ORAL

## 2017-02-24 MED ORDER — METOCLOPRAMIDE HCL 5 MG/ML IJ SOLN
5.0000 mg | INTRAMUSCULAR | Status: DC | PRN
Start: 2017-02-24 — End: 2017-02-24

## 2017-02-24 MED ORDER — MIDAZOLAM HCL 2 MG/2ML IJ SOLN
INTRAMUSCULAR | Status: AC
Start: 2017-02-24 — End: ?
  Filled 2017-02-24: qty 2

## 2017-02-24 MED ORDER — LACTATED RINGERS IV BOLUS
500.0000 mL | Freq: Once | INTRAVENOUS | Status: DC | PRN
Start: 2017-02-24 — End: 2017-02-24

## 2017-02-24 MED ORDER — CEFAZOLIN SODIUM-DEXTROSE 2-3 GM-% IV SOLR
INTRAVENOUS | Status: AC
Start: 2017-02-24 — End: ?
  Filled 2017-02-24: qty 50

## 2017-02-24 MED ORDER — OXYCODONE-ACETAMINOPHEN 5-325 MG PO TABS
1.0000 | ORAL_TABLET | ORAL | 0 refills | Status: DC | PRN
Start: 2017-02-24 — End: 2017-05-25
  Filled 2017-02-24: qty 4, 1d supply, fill #0

## 2017-02-24 MED ORDER — GABAPENTIN 100 MG PO CAPS
ORAL_CAPSULE | ORAL | Status: AC
Start: 2017-02-24 — End: ?
  Filled 2017-02-24: qty 1

## 2017-02-24 MED ORDER — FAMOTIDINE 20 MG/2ML IV SOLN
INTRAVENOUS | Status: AC
Start: 2017-02-24 — End: ?
  Filled 2017-02-24: qty 2

## 2017-02-24 MED ORDER — MEPERIDINE HCL 25 MG/ML IJ SOLN
12.5000 mg | INTRAMUSCULAR | Status: DC | PRN
Start: 2017-02-24 — End: 2017-02-24
  Filled 2017-02-24: qty 1

## 2017-02-24 MED ORDER — GABAPENTIN 100 MG PO CAPS
100.0000 mg | ORAL_CAPSULE | Freq: Once | ORAL | Status: AC
Start: 2017-02-24 — End: 2017-02-24
  Administered 2017-02-24: 08:00:00 100 mg via ORAL

## 2017-02-24 MED ORDER — ONDANSETRON HCL 4 MG/2ML IJ SOLN
INTRAMUSCULAR | Status: DC | PRN
Start: 2017-02-24 — End: 2017-02-24
  Administered 2017-02-24: 4 mg via INTRAVENOUS

## 2017-02-24 MED ORDER — DEXAMETHASONE SODIUM PHOSPHATE 4 MG/ML IJ SOLN (WRAP)
INTRAMUSCULAR | Status: DC | PRN
Start: 2017-02-24 — End: 2017-02-24
  Administered 2017-02-24: 4 mg via INTRAVENOUS

## 2017-02-24 MED ORDER — ONDANSETRON HCL 4 MG/2ML IJ SOLN
4.0000 mg | Freq: Once | INTRAMUSCULAR | Status: DC | PRN
Start: 2017-02-24 — End: 2017-02-24

## 2017-02-24 MED ORDER — BUPIVACAINE-EPINEPHRINE (PF) 0.5% -1:200000 IJ SOLN
INTRAMUSCULAR | Status: AC
Start: 2017-02-24 — End: ?
  Filled 2017-02-24: qty 30

## 2017-02-24 MED ORDER — BUPIVACAINE-EPINEPHRINE (PF) 0.5% -1:200000 IJ SOLN
INTRAMUSCULAR | Status: DC | PRN
Start: 2017-02-24 — End: 2017-02-24
  Administered 2017-02-24: 10 mL

## 2017-02-24 MED ORDER — DEXAMETHASONE SODIUM PHOSPHATE 20 MG/5ML IJ SOLN
INTRAMUSCULAR | Status: AC
Start: 2017-02-24 — End: ?
  Filled 2017-02-24: qty 5

## 2017-02-24 MED ORDER — PROPOFOL 10 MG/ML IV EMUL (WRAP)
INTRAVENOUS | Status: AC
Start: 2017-02-24 — End: ?
  Filled 2017-02-24: qty 20

## 2017-02-24 MED ORDER — ONDANSETRON HCL 4 MG/2ML IJ SOLN
INTRAMUSCULAR | Status: AC
Start: 2017-02-24 — End: ?
  Filled 2017-02-24: qty 2

## 2017-02-24 MED ORDER — LACTATED RINGERS IV SOLN
INTRAVENOUS | Status: DC
Start: 2017-02-24 — End: 2017-02-24

## 2017-02-24 MED ORDER — PROPOFOL 10 MG/ML IV EMUL (WRAP)
INTRAVENOUS | Status: DC | PRN
Start: 2017-02-24 — End: 2017-02-24
  Administered 2017-02-24: 30 mg via INTRAVENOUS
  Administered 2017-02-24: 50 mg via INTRAVENOUS

## 2017-02-24 MED ORDER — LACTATED RINGERS IV SOLN
75.0000 mL/h | INTRAVENOUS | Status: DC
Start: 2017-02-24 — End: 2017-02-24

## 2017-02-24 MED ORDER — GLYCOPYRROLATE 0.2 MG/ML IJ SOLN
INTRAMUSCULAR | Status: AC
Start: 2017-02-24 — End: ?
  Filled 2017-02-24: qty 1

## 2017-02-24 MED ORDER — FAMOTIDINE 10 MG/ML IV SOLN (WRAP)
20.0000 mg | Freq: Once | INTRAVENOUS | Status: AC
Start: 2017-02-24 — End: 2017-02-24
  Administered 2017-02-24: 08:00:00 20 mg via INTRAVENOUS

## 2017-02-24 SURGICAL SUPPLY — 41 items
APPLCATOR CHLORAPREP 26ML (Prep) ×4 IMPLANT
APPLICATOR COTTON TIP STRL 6IN (Prep) ×8 IMPLANT
CLIP INTERNAL MEDIUM CHEVRON 24 (Clips) IMPLANT
CLIP WECK HRZN INTNL MED CHEVRON TI 24 (Clips)
CNTNR SPEC W LLDPE 16OZ LEK SHTR RST (Procedure Accessories) ×1
CONTAINER SPEC LLDPE 16OZ W LF NS LEK (Procedure Accessories) ×1
CONTAINER SPECIMEN C16 OZ WIDE LEAK SHATTER RESISTANT SNAP ON LID (Procedure Accessories) ×1 IMPLANT
COVER CAM LF STRL LEN DISP CLR (Procedure Accessories)
COVER CAMERA LENS DISPOSABLE CLEAR STERILE LATEX FREE (Procedure Accessories) IMPLANT
DRESSING ADH GZE 5X4IN LF STRL COMP ILND (Dressing) ×1
DRESSING ADHESIVE MEDLINE L5 IN X W4 IN (Dressing) ×1
DRESSING ADHSV MEDLN L5 IN X W4 IN COMPOSITE ISLAND GAUZE 2.5X2IN (Dressing) IMPLANT
DRESSING SCR TGDRM 4.5X3.5IN LF STRL FLM (Dressing) ×1
DRESSING SECUREMENT TEGADERM L4 1/2 IN X (Dressing) ×1
DRESSING SECUREMENT TEGADERM L4 1/2 IN X W3 1/2 IN INTRAVENOUS FILM (Dressing) IMPLANT
DRESSING TRANSPARENT L4 3/4 IN X W4 IN (Dressing) ×1
DRESSING TRANSPARENT L4 3/4 IN X W4 IN POLYURETHANE ADHESIVE (Dressing) ×1 IMPLANT
DRESSING TRNS PU STD TGDRM 4.75X4IN LF (Dressing) ×1
GAUZE SPONGE VERSLN 4PLY 4X4IN (Dressing) ×8 IMPLANT
GLOVE SURG BIOGEL INDIC SZ 6.5 (Glove) ×2 IMPLANT
GLOVE SURG BIOGEL SZ6.5 (Glove) ×4 IMPLANT
HRST DONUT HL-IN-ONE (Positioning Supplies) ×2 IMPLANT
KIT MAJOR FFX (Kits) ×2 IMPLANT
MASTISOL VIAL 2/3CC STRL (Skin Closure) ×2 IMPLANT
NEEDLE  LOC BREAST 20GX5CM (Needles) ×1
NEEDLE LOC BREAST 20GX5CM (Needles) ×1
NEEDLE LOCALIZATION L5 CM OD20 GA KOPANS EASY THREAD WIRE STIFFEN (Needles) ×1 IMPLANT
PAD ELECTROSRG GRND REM W CRD (Procedure Accessories) ×2 IMPLANT
PAD PREP CUFF 24X41IN W 9IN (Prep) ×4 IMPLANT
SET SURGICAL BASIN TRANSFER (Kits)
SET SURGICAL BASIN TRANSFER MEDLINE INDUSTRIES, INC. (Kits) IMPLANT
SET TRANSFER BASIN (Kits)
STRIP SKIN CLOSURE L4 IN X W1/2 IN (Dressing) ×1
STRIP SKIN CLOSURE L4 IN X W1/2 IN REINFORCE STERI-STRIP POLYESTER (Dressing) ×1 IMPLANT
STRIP SKNCLS PLSTR STRSTRP 4X.5IN LF (Dressing) ×1
SUTURE ABS 3-0 SH MNCRL 27IN MFL UD (Suture) ×1
SUTURE MONOCRYL 3-0 SH L27 IN (Suture) ×1
SUTURE MONOCRYL 3-0 SH L27 IN MONOFILAMENT UNDYED ABSORBABLE (Suture) ×1 IMPLANT
SUTURE MONOCRYL 4-0 PS2 27IN (Suture) ×2 IMPLANT
SUTURE PLN GUT 5.0 (Suture) IMPLANT
TOWEL STERILE REUSABLE 8PK (Procedure Accessories) IMPLANT

## 2017-02-24 NOTE — Anesthesia Postprocedure Evaluation (Signed)
Anesthesia Post Evaluation    Patient: Pharmacologist    Procedure(s) with comments:  BIOPSY, BREAST, TUMOR EXCISION, ULTRASOUND NEEDLE LOCALIZATION - LEFT BREAST NLOC EXCISIONAL BIOPSY WITH INTRA-OP WIRE LOCALIZATION    Anesthesia type: General TIVA    Last Vitals:   Vitals:    02/24/17 1030   BP: 120/86   Pulse: 68   Resp: 16   Temp:    SpO2: 100%       Patient Location: PACU      Post Pain: Patient not complaining of pain, continue current therapy    Mental Status: awake and alert    Respiratory Function: tolerating room air    Cardiovascular: stable    Nausea/Vomiting: patient not complaining of nausea or vomiting    Hydration Status: adequate    Post Assessment: no apparent anesthetic complications, no reportable events and no evidence of recall          Anesthesia Qualified Clinical Data Registry 2018    PACU Reintubation  Did the Patient have general anesthesia with intubation: Yes  Did the Patient require reintubation in the PACU?: No  Was this a planned exubation trial (documented in the medical record)?: No    PONV Adult  Is the patient aged 24 or older: Yes  Did the patient receive recieve a general anesthestic: Yes  Does the patient have 3 or more risk factors for PONV? Yes  Did the patient receive anti-emetics from at least two classes of medications? Yes      PONV Pediatric  Is the patient aged 73-17? No            PACU Transfer Checklist Protocol  Was the patient transferred to the PACU at the conclusion of surgery? Yes  Was a checklist or transfer protocol used? Yes    ICU Transfer Checklist Protocol  Was the patient transferred to the ICU at the conclusion of surgery? No      Post-op Pain Assessment Prior to Anesthesia Care End  Age >=18 and assessed for pain in PACU: Yes  Pacu pain score <7/10: Yes      Perioperative Mortality  Perioperative mortality prior to Anesthesia end time: No    Perioperative Cardiac Arrest  Did the patient have an unanticipated intraoperative cardiac arrest between  anesthesia start time and anesthesia end time? No    Unplanned Admission to ICU  Did the patient have an unplanned admission to the ICU (not initially anticipated at anesthesia start time)? No      Signed by: Nils Flack Arietta Eisenstein, 02/24/2017 10:50 AM

## 2017-02-24 NOTE — Brief Op Note (Addendum)
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 Lindi Adie          Specimen Information    Specimen Testing Required Routine Pathology       Specimen ID  [id number]       Specimen Description LEFT 2:30 FIBROADENOMA       Breast Tissue? yes       Excision Time 0902       Time in Formalin 0908           Findings:   Mass removed    Complications:   none    Signed by: Tilda Burrow, MD                                                                           [facility]

## 2017-02-24 NOTE — Discharge Instructions (Signed)

## 2017-02-24 NOTE — Op Note (Signed)
Procedure Date: 02/24/2017     Patient Type: A     SURGEON: Harvest Stanco MD  ASSISTANT:       PREOPERATIVE DIAGNOSIS:  Left 2:38 enlarging fibroadenoma.     POSTOPERATIVE DIAGNOSIS:  Left 2:38 enlarging fibroadenoma.     TITLE OF PROCEDURE:  Left excisional biopsy, left ultrasound-guided needle localization.     ANESTHESIA:  MAC.     INDICATIONS:  Enlarging fibroadenoma.     DESCRIPTION OF PROCEDURE:  The patient was brought to the operating room, Venodynes were placed  bilaterally, and sedation was established.  Using the ultrasound, the mass  and the clip were identified.  Images were captured.  Guidewire was passed  through it.  Images were again captured.  The patient was then prepped and  draped in standard surgical fashion.  A small curvilinear incision was made  laterally.  Superior, medial, and lateral flaps were raised until the mass  was reached.  The needle and the mass were removed using blunt and sharp  dissection.  The cavity was irrigated.  It was made hemostatic.  The  incision was closed with 3-0 Monocryl interrupted and 4-0 Monocryl  subcuticular.  Sterile dressing was applied.           D:  02/24/2017 09:12 AM by Dr. Tilda Burrow, MD ([id number])  T:  02/24/2017 17:10 PM by NTS      (Conf: [id number]) (Doc ID: [id number])

## 2017-02-24 NOTE — Transfer of Care (Signed)
Anesthesia Transfer of Care Note    Patient: [name]    Procedures performed: Procedure(s) with comments:  BIOPSY, BREAST, TUMOR EXCISION, ULTRASOUND NEEDLE LOCALIZATION - LEFT BREAST NLOC EXCISIONAL BIOPSY WITH INTRA-OP WIRE LOCALIZATION    Anesthesia type: General TIVA    Patient location:Phase I PACU    Last vitals:   Vitals:    02/24/17 0926   BP: 93/50   Pulse: 79   Resp: 16   Temp: (!) 36 C (96.8 F)   SpO2: 100%       Post pain: Patient not complaining of pain, continue current therapy      Mental Status:sedated    Respiratory Function: tolerating face mask    Cardiovascular: stable    Nausea/Vomiting: patient not complaining of nausea or vomiting    Hydration Status: adequate    Post assessment: no apparent anesthetic complications, no reportable events and no evidence of recall    Signed by: Rubye Oaks  02/24/17 9:27 AM

## 2017-02-24 NOTE — Anesthesia Preprocedure Evaluation (Signed)
Anesthesia Evaluation    AIRWAY    Mallampati: II    TM distance: <3 FB  Neck ROM: full  Mouth Opening:full   CARDIOVASCULAR    regular and normal       DENTAL    no notable dental hx     PULMONARY    clear to auscultation     OTHER FINDINGS              Relevant Problems   No relevant active problems               Anesthesia Plan    ASA 1     general                     Detailed anesthesia plan: general IV        Post op pain management: per surgeon    informed consent obtained    Plan discussed with CRNA.                   Signed by: Nils Flack Giani Betzold 02/24/17 8:03 AM

## 2017-02-24 NOTE — Discharge Instr - AVS First Page (Signed)
Leave pressure dressing on for 48 hours. May shower. Can remove clear plastic dressing after 48 hours. Leave steri-strips in place. Diet as tolerated.    [facility] [phone]  DR. Tailer Volkert  DISCHARGE INSTRUCTIONS AFTER BREAST SURGERY    1. The dressing is water proof and if it looks intact you may shower. 48 hours after surgery remove the dressing. Wash the incision gently with warm water and mild soap and pat the area dry. You do not need to redress the incision.  2. Steri-strips are strips of white tape used to close the incision. It is okay to shower with these on. Do not pull them off; the will fall off on their own.   3. Wear a comfortable bra at all times, even to bed, this helps to keep swelling down.  4. You may return to your regular diet as you feel able. A healthy well-balanced diet is encouraged and drink lots of fluid.  5. Take pain reliever as directed. Most narcotics are constipating so plan accordingly.  6. You may resume your previous medications, unless otherwise instructed by your surgeon or primary care physician.  7. Bruising and mild swelling are common after surgery . Use an ice pack or bag of frozen peas wrapped in a cloth over the incision for no more than 20 minutes at a time. Do this as needed.   8. You may resume driving once you are no longer using the prescribed pain medication. Do not drive if you have a surgical drain.  9. Never drink alcohol or drive a vehicle while taking pain medication.  10. Do not lift more than 10 pounds. It is OK to walk up and down stairs. Keep your activity light until returning for your post-op visit.  11. Pain medications can be constipating. Take an over the counter stool softener as needed and maintain good hydration. Drink at least 6-8 glasses of non-caffeinated beverages a day.  12. You will need to come in for a follow up appointment within 1-2 weeks after surgery. If you do not already have this appointment scheduled with me,  please call the office to do so. At this visit, your pathology report should be available and will be discussed.    If you had a sentinel lymph node biopsy:  Your urine will be bluish in color after surgery. Your skin may also have a faint bluish color. This will go away as the dye clears from your body. Staying hydrated is important to help flush your system.     If you don't already have an appointment for post operative follow up call the office ([phone]) and make an appointment in about a week.    Possible reasons to call the office:  -Fever over 101 F  -Redness around the wound or drainage from the wound.  -Separation of the skin at the incision site  -Pain not relieved by the pain medication  -chest pain or shortness of breath

## 2017-02-25 ENCOUNTER — Encounter (HOSPITAL_BASED_OUTPATIENT_CLINIC_OR_DEPARTMENT_OTHER): Payer: Self-pay | Admitting: Surgery

## 2017-02-25 LAB — LAB USE ONLY - HISTORICAL SURGICAL PATHOLOGY

## 2017-03-03 ENCOUNTER — Telehealth (INDEPENDENT_AMBULATORY_CARE_PROVIDER_SITE_OTHER): Payer: Self-pay | Admitting: Surgery

## 2017-03-03 NOTE — Telephone Encounter (Signed)
Good news on path  Hope she is feeling well

## 2017-03-04 ENCOUNTER — Ambulatory Visit (INDEPENDENT_AMBULATORY_CARE_PROVIDER_SITE_OTHER): Payer: No Typology Code available for payment source | Admitting: Surgery

## 2017-03-04 ENCOUNTER — Encounter (INDEPENDENT_AMBULATORY_CARE_PROVIDER_SITE_OTHER): Payer: Self-pay | Admitting: Surgery

## 2017-03-04 VITALS — BP 117/79 | HR 72 | Temp 98.6°F | Ht 66.0 in | Wt 129.6 lb

## 2017-03-04 DIAGNOSIS — D242 Benign neoplasm of left breast: Secondary | ICD-10-CM

## 2017-03-04 NOTE — Progress Notes (Signed)
Have you sought care outside of [facility] since we last saw you?   No

## 2017-03-04 NOTE — Progress Notes (Signed)
Subjective:       Patient ID: [name] is a 26 y.o. female.    HPI    10/21/16 Bilateral breast ultrasound shows the previous mass in the left breast 2:30, has increased in size to 2.4 x 1.3 x 1.9 initially measured 1.3 x 0.7 x 1.1  01/18/17 left ultrasound-guided core biopsy revealed a fibroadenoma  02/24/17 left breast 2:30.  Excisional biopsy revealed a 3 cm fibroadenoma  She is healing well with no breast or other health complaints.    The following portions of the patient's history were reviewed and updated as appropriate: allergies, current medications, past family history, past medical history, past social history, past surgical history and problem list.    Review of Systems    ROS:  Constitutional:  negative  HEENT:  negative  Cardiovascular:  negative  Respiratory:  negative  Gastrointestinal:  negative  Genitourinary:  negative  Musculoskeletal:  negative  Skin:  negative  Neurologic:  negative  Psychiatric:  negative  Endocrine:  negative  Hematologic:  negative  Other:  All other systems negative                                Objective:    Physical Exam      LN:  No axillary, supraclavicular or cervical adenopathy  Breast:  Left lateral incision healing well        Assessment:       Left 2:30 fibroadenoma      Plan:      Procedures      She feels anything new or concerning.  She will return right away.  Otherwise, she will start annual screening at the appropriate age.

## 2017-05-09 ENCOUNTER — Other Ambulatory Visit: Payer: Self-pay

## 2017-05-12 ENCOUNTER — Encounter (INDEPENDENT_AMBULATORY_CARE_PROVIDER_SITE_OTHER): Payer: Self-pay | Admitting: Family Medicine

## 2017-05-25 ENCOUNTER — Encounter (INDEPENDENT_AMBULATORY_CARE_PROVIDER_SITE_OTHER): Payer: Self-pay | Admitting: Family Medicine

## 2017-05-25 ENCOUNTER — Ambulatory Visit (INDEPENDENT_AMBULATORY_CARE_PROVIDER_SITE_OTHER): Payer: No Typology Code available for payment source | Admitting: Family Medicine

## 2017-05-25 VITALS — BP 123/75 | HR 88 | Temp 99.2°F | Ht 66.75 in | Wt 127.4 lb

## 2017-05-25 DIAGNOSIS — L42 Pityriasis rosea: Secondary | ICD-10-CM

## 2017-05-25 NOTE — Progress Notes (Signed)
Subjective:      Patient ID: [name] is a 26 y.o. female     Chief Complaint   Patient presents with   . Rash     abdominal and back area        HPI   3 weeks ago had small rash. Used hydrocortisone. The original lesion resolved, but she has new lesions around the body. They are pruritic. No sick contacts. Not sure of trigger. Had travel vaccines prior to travel to [place].     The following sections were reviewed this encounter by the provider:   Allergies  Meds  Problems       Review of Systems   No cough/cold      BP 123/75 (BP Site: Left arm, Patient Position: Sitting, Cuff Size: Medium)   Pulse 88   Temp 99.2 F (37.3 C) (Oral)   Ht 1.695 m (5' 6.75")   Wt 57.8 kg (127 lb 6.4 oz)   BMI 20.10 kg/m      Objective:     Physical Exam   Constitutional: She is oriented to person, place, and time. She appears well-developed and well-nourished.   Neurological: She is alert and oriented to person, place, and time.   Skin:   Scaly, purplish patch, about 3cm over abdomen. Multiple eruythematous macules over her trunk and back.  Each one about 2 mm.   Psychiatric: She has a normal mood and affect. Her behavior is normal.   Nursing note and vitals reviewed.    Assessment:     1. Pityriasis rosea  - Ambulatory referral to Dermatology      Although not in typical distribution.  Widespread lesions after herald patch makes this a possible diagnosis.  Patient declined steroids for pruritus.  She wanted to know if there is anything else to stem the new lesions.  Referred to dermatology for further evaluation.  Plan:     There are no Patient Instructions on file for this visit.    Terressa Koyanagi, MD

## 2017-05-25 NOTE — Progress Notes (Signed)
Have you seen any specialists/other providers since your last visit with us?    No    Arm preference verified?   Yes    The patient is due for pap smear and influenza vaccine

## 2017-05-27 ENCOUNTER — Telehealth (INDEPENDENT_AMBULATORY_CARE_PROVIDER_SITE_OTHER): Payer: Self-pay | Admitting: Family Medicine

## 2017-05-27 ENCOUNTER — Encounter (INDEPENDENT_AMBULATORY_CARE_PROVIDER_SITE_OTHER): Payer: Self-pay | Admitting: Family Medicine

## 2017-06-01 ENCOUNTER — Other Ambulatory Visit: Payer: No Typology Code available for payment source

## 2017-06-01 ENCOUNTER — Ambulatory Visit (INDEPENDENT_AMBULATORY_CARE_PROVIDER_SITE_OTHER): Payer: No Typology Code available for payment source | Admitting: Family Medicine

## 2017-06-01 ENCOUNTER — Encounter (INDEPENDENT_AMBULATORY_CARE_PROVIDER_SITE_OTHER): Payer: Self-pay | Admitting: Family Medicine

## 2017-06-01 VITALS — BP 118/80 | HR 80 | Temp 97.7°F | Resp 17 | Ht 66.0 in | Wt 127.0 lb

## 2017-06-01 DIAGNOSIS — R0789 Other chest pain: Secondary | ICD-10-CM

## 2017-06-01 DIAGNOSIS — R042 Hemoptysis: Secondary | ICD-10-CM

## 2017-06-01 NOTE — Patient Instructions (Addendum)
Your exam and EKG were reassuring.  We will get one more lab to rule out clots.  Left me know of new symptoms.

## 2017-06-01 NOTE — Progress Notes (Signed)
Subjective:      Patient ID: [name] is a 26 y.o. female     Chief Complaint   Patient presents with   . Cough     with blood         HPI   Cleared out throat this morning with blood. She states it is a glob of blood when it comes on Could taste it throughout day yesterday. No meds; fnished malarone Saturday. Using hydrocortizone for rashes. Mild sore throat that resolved, no fever. No cough, intermittent if any. No post nasal drip, Unsure if has had blood in nasal discharge.  Has some nausea, somewhat chronic, improves with water/ginger ale. . Came back from [place] last Monday. She occasionally has some chest tightness.  It is difficult for her to describe.    The following sections were reviewed this encounter by the provider:   Allergies  Meds  Problems         Review of Systems   No weight loss, no night sweats.      BP 118/80   Pulse 80   Temp 97.7 F (36.5 C) (Oral)   Resp 17   Ht 1.676 m (5\' 6" )   Wt 57.6 kg (127 lb)   SpO2 98%   BMI 20.50 kg/m      Objective:     Physical Exam   Constitutional: She is oriented to person, place, and time. She appears well-developed and well-nourished.   HENT:   Mouth/Throat: Oropharynx is clear and moist. No oropharyngeal exudate.   Nasal mucosal edema.   Cardiovascular: Normal rate, regular rhythm and normal heart sounds.  Exam reveals no friction rub.    No murmur heard.  Pulmonary/Chest: Effort normal and breath sounds normal. No respiratory distress. She has no wheezes.   Walking pulse oximetry 96 percent.   Neurological: She is alert and oriented to person, place, and time.   Skin: Skin is warm and dry.   Psychiatric: She has a normal mood and affect. Her behavior is normal.   Nursing note and vitals reviewed.    Assessment:     1. Chest tightness  - D-dimer, quantitative  - ECG 12 lead    2. Hemoptysis        Plan:     Patient Instructions   Your exam and EKG were reassuring.  We will get one more lab to rule out clots.  Left me know of new  symptoms.      Terressa Koyanagi, MD

## 2017-06-05 LAB — D-DIMER, QUANTITATIVE

## 2017-11-07 ENCOUNTER — Encounter (INDEPENDENT_AMBULATORY_CARE_PROVIDER_SITE_OTHER): Payer: Self-pay | Admitting: Family Medicine

## 2017-11-08 ENCOUNTER — Encounter (INDEPENDENT_AMBULATORY_CARE_PROVIDER_SITE_OTHER): Payer: Self-pay | Admitting: Surgery

## 2017-11-12 ENCOUNTER — Encounter (INDEPENDENT_AMBULATORY_CARE_PROVIDER_SITE_OTHER): Payer: Self-pay | Admitting: Family Medicine

## 2017-11-13 ENCOUNTER — Encounter (INDEPENDENT_AMBULATORY_CARE_PROVIDER_SITE_OTHER): Payer: Self-pay

## 2017-12-14 ENCOUNTER — Encounter (INDEPENDENT_AMBULATORY_CARE_PROVIDER_SITE_OTHER): Payer: Self-pay

## 2017-12-19 ENCOUNTER — Encounter (INDEPENDENT_AMBULATORY_CARE_PROVIDER_SITE_OTHER): Payer: Self-pay | Admitting: Family Medicine

## 2018-01-13 ENCOUNTER — Encounter (INDEPENDENT_AMBULATORY_CARE_PROVIDER_SITE_OTHER): Payer: Self-pay

## 2018-02-13 ENCOUNTER — Encounter (INDEPENDENT_AMBULATORY_CARE_PROVIDER_SITE_OTHER): Payer: Self-pay

## 2018-03-15 ENCOUNTER — Encounter (INDEPENDENT_AMBULATORY_CARE_PROVIDER_SITE_OTHER): Payer: Self-pay

## 2018-04-15 ENCOUNTER — Encounter (INDEPENDENT_AMBULATORY_CARE_PROVIDER_SITE_OTHER): Payer: Self-pay

## 2018-05-11 ENCOUNTER — Other Ambulatory Visit: Payer: No Typology Code available for payment source

## 2018-05-11 ENCOUNTER — Ambulatory Visit (INDEPENDENT_AMBULATORY_CARE_PROVIDER_SITE_OTHER): Payer: No Typology Code available for payment source | Admitting: Family Medicine

## 2018-05-11 ENCOUNTER — Encounter (INDEPENDENT_AMBULATORY_CARE_PROVIDER_SITE_OTHER): Payer: Self-pay | Admitting: Family Medicine

## 2018-05-11 VITALS — BP 92/60 | HR 69 | Temp 98.3°F | Ht 66.5 in | Wt 121.0 lb

## 2018-05-11 DIAGNOSIS — Z0000 Encounter for general adult medical examination without abnormal findings: Secondary | ICD-10-CM

## 2018-05-11 NOTE — Patient Instructions (Signed)
We did your physical today. We talked about diet and exercise.  Please get your blood work done fasting.

## 2018-05-11 NOTE — Progress Notes (Signed)
Subjective:      Patient ID: [name] is a 27 y.o. female.    Chief Complaint:  Chief Complaint   Patient presents with   . Annual Exam       HPI:  HPI   Diet: eats veg 2-3x/week. Fast food 1-2x/week. Otherwise home cooked. Has a banana for breakfast, not a lot of fruits. Mostly meats and carbs. She thinks she will get better now that she is not traveling so much. Hopes to be meal prepping more by Sept 15.   Exercise: time is a barrier. Used to do cardio/boxing. Thinks will get back into it. Has a gym nearby.   Sleep: 6-8 hours. If she works close to bed time this interferes with sleep. Sometimes schedule is demanding.   Stress: depends on day, generally low-medium.   Mood: happy    Pap 2017nL per pt  Td - UTD per pt      Problem List:  There is no problem list on file for this patient.      Current Medications:  No current outpatient prescriptions on file.     No current facility-administered medications for this visit.        Allergies:  No Known Allergies    Past Medical History:  Past Medical History:   Diagnosis Date   . 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 BIOPSY, BREAST, TUMOR EXCISION, ULTRASOUND NEEDLE LOCALIZATION Left 02/24/2017    Procedure: BIOPSY, BREAST, TUMOR EXCISION, ULTRASOUND NEEDLE LOCALIZATION;  Surgeon: Tilda Burrow, MD;  Location: [facility];  Service: General;  Laterality: Left;  LEFT BREAST NLOC EXCISIONAL BIOPSY WITH INTRA-OP WIRE LOCALIZATION       Family History:  Family History   Problem Relation Age of Onset   . No known problems Mother    . Lung cancer Father    . No known problems Sister    . No known problems Brother    . No known problems Sister    . No known problems Sister    . No known problems Sister    . Breast cancer Paternal Aunt 70       Social History:  Social History     Social History   . Marital status: Married     Spouse name: N/A   . Number of children: N/A   .  Years of education: N/A     Occupational History   . auditor      Social History Main Topics   . Smoking status: Never Smoker   . Smokeless tobacco: Never Used   . Alcohol use Yes      Comment: 1-2 drinks per month (wine)   . Drug use: No   . Sexual activity: Yes     Partners: Male     Birth control/ protection: Condom     Other Topics Concern   . Not on file     Social History Narrative   . No narrative on file       The following sections were reviewed this encounter by the provider:   Tobacco  Allergies  Meds  Problems  Med Hx  Surg Hx  Fam Hx  Soc Hx          ROS:  Review of Systems   Constitutional: Negative for chills and fever.   HENT: Negative for congestion. Dental problem: sees dentist.  Eyes: Visual disturbance: needs eye dr.   Respiratory: Positive for cough (weather related).    Cardiovascular: Negative for chest pain.   Gastrointestinal: Negative for abdominal pain.   Genitourinary: Positive for dysuria (int, resolves with hydration).   Musculoskeletal: Positive for arthralgias (occ foot pain). Negative for myalgias.   Skin: Negative for rash.   Allergic/Immunologic: Negative for environmental allergies and food allergies.   Neurological: Negative for headaches.   Hematological: Does not bruise/bleed easily.   Psychiatric/Behavioral: Negative for dysphoric mood and sleep disturbance.     Vitals:  BP 92/60 (BP Site: Left arm, Patient Position: Sitting, Cuff Size: X-Small)   Pulse 69   Temp 98.3 F (36.8 C) (Oral)   Ht 1.689 m (5' 6.5")   Wt 54.9 kg (121 lb)   LMP 04/06/2018   BMI 19.24 kg/m      Objective:     Physical Exam:  Physical Exam   Constitutional: She is oriented to person, place, and time. She appears well-developed and well-nourished.   HENT:   Head: Normocephalic and atraumatic.   Right Ear: Hearing, tympanic membrane, external ear and ear canal normal.   Left Ear: Hearing, tympanic membrane, external ear and ear canal normal.   Mouth/Throat: Oropharynx is clear and moist.    Eyes: Pupils are equal, round, and reactive to light. EOM are normal.   Cardiovascular: Normal rate, regular rhythm and normal heart sounds.  Exam reveals no gallop and no friction rub.    No murmur heard.  Pulmonary/Chest: Effort normal and breath sounds normal. No respiratory distress. She has no wheezes. She has no rales.   Breasts: breasts appear normal, no suspicious masses, no skin or nipple changes or axillary nodes. Chaperone Mayra Neer LPN     Abdominal: Soft. Bowel sounds are normal. She exhibits no distension. There is no tenderness.   Musculoskeletal: Normal range of motion.   Lymphadenopathy:     She has no cervical adenopathy.   Neurological: She is alert and oriented to person, place, and time.   Skin: Skin is warm and dry.   Psychiatric: She has a normal mood and affect. Her behavior is normal.   Nursing note and vitals reviewed.       Assessment:     1. Routine general medical examination at a health care facility  - Lipid panel  - Comprehensive metabolic panel  - CBC w/o diff      Plan:     Patient Instructions   We did your physical today. We talked about diet and exercise.  Please get your blood work done fasting.          Terressa Koyanagi, MD

## 2018-05-11 NOTE — Progress Notes (Signed)
Have you seen any specialists/other providers since your last visit with us?    No    Arm preference verified?   Yes    The patient is due for pap smear and influenza vaccine

## 2018-05-12 LAB — COMPREHENSIVE METABOLIC PANEL
ALT: 6 IU/L (ref 0–32)
AST (SGOT): 14 IU/L (ref 0–40)
Albumin/Globulin Ratio: 1.8 (ref 1.2–2.2)
Albumin: 4.5 g/dL (ref 3.5–5.5)
Alkaline Phosphatase: 58 IU/L (ref 39–117)
BUN / Creatinine Ratio: 18 (ref 9–23)
BUN: 13 mg/dL (ref 6–20)
Bilirubin, Total: 0.2 mg/dL (ref 0.0–1.2)
CO2: 23 mmol/L (ref 20–29)
Calcium: 9.4 mg/dL (ref 8.7–10.2)
Chloride: 102 mmol/L (ref 96–106)
Creatinine: 0.74 mg/dL (ref 0.57–1.00)
EGFR: 111 mL/min/{1.73_m2} (ref >59)
EGFR: 128 mL/min/{1.73_m2} (ref >59)
Globulin, Total: 2.5 g/dL (ref 1.5–4.5)
Glucose: 81 mg/dL (ref 65–99)
Potassium: 4.3 mmol/L (ref 3.5–5.2)
Protein, Total: 7 g/dL (ref 6.0–8.5)
Sodium: 140 mmol/L (ref 134–144)

## 2018-05-12 LAB — CBC
Hematocrit: 42.6 % (ref 34.0–46.6)
Hemoglobin: 13.9 g/dL (ref 11.1–15.9)
MCH: 30.3 pg (ref 26.6–33.0)
MCHC: 32.6 g/dL (ref 31.5–35.7)
MCV: 93 fL (ref 79–97)
Platelets: 218 10*3/uL (ref 150–450)
RBC: 4.59 x10E6/uL (ref 3.77–5.28)
RDW: 12.3 % (ref 12.3–15.4)
WBC: 5 10*3/uL (ref 3.4–10.8)

## 2018-05-12 LAB — LIPID PANEL
Cholesterol / HDL Ratio: 2.9 ratio (ref 0.0–4.4)
Cholesterol: 183 mg/dL (ref 100–199)
HDL: 64 mg/dL (ref >39)
LDL Calculated: 108 mg/dL — ABNORMAL HIGH (ref 0–99)
Triglycerides: 56 mg/dL (ref 0–149)
VLDL Calculated: 11 mg/dL (ref 5–40)

## 2018-07-27 ENCOUNTER — Encounter (INDEPENDENT_AMBULATORY_CARE_PROVIDER_SITE_OTHER): Payer: Self-pay

## 2018-08-24 ENCOUNTER — Encounter (INDEPENDENT_AMBULATORY_CARE_PROVIDER_SITE_OTHER): Payer: Self-pay

## 2019-01-13 ENCOUNTER — Encounter (INDEPENDENT_AMBULATORY_CARE_PROVIDER_SITE_OTHER): Payer: Self-pay

## 2019-02-13 ENCOUNTER — Encounter (INDEPENDENT_AMBULATORY_CARE_PROVIDER_SITE_OTHER): Payer: Self-pay

## 2019-03-15 ENCOUNTER — Encounter (INDEPENDENT_AMBULATORY_CARE_PROVIDER_SITE_OTHER): Payer: Self-pay

## 2019-04-15 ENCOUNTER — Encounter (INDEPENDENT_AMBULATORY_CARE_PROVIDER_SITE_OTHER): Payer: Self-pay

## 2019-05-16 ENCOUNTER — Encounter (INDEPENDENT_AMBULATORY_CARE_PROVIDER_SITE_OTHER): Payer: Self-pay

## 2019-06-15 ENCOUNTER — Encounter (INDEPENDENT_AMBULATORY_CARE_PROVIDER_SITE_OTHER): Payer: Self-pay

## 2019-07-16 ENCOUNTER — Encounter (INDEPENDENT_AMBULATORY_CARE_PROVIDER_SITE_OTHER): Payer: Self-pay

## 2019-08-01 ENCOUNTER — Encounter (INDEPENDENT_AMBULATORY_CARE_PROVIDER_SITE_OTHER): Payer: Self-pay

## 2019-08-10 ENCOUNTER — Encounter (INDEPENDENT_AMBULATORY_CARE_PROVIDER_SITE_OTHER): Payer: Self-pay | Admitting: Family Medicine

## 2019-08-11 ENCOUNTER — Encounter (INDEPENDENT_AMBULATORY_CARE_PROVIDER_SITE_OTHER): Payer: Self-pay | Admitting: Family Medicine

## 2019-08-11 ENCOUNTER — Ambulatory Visit (INDEPENDENT_AMBULATORY_CARE_PROVIDER_SITE_OTHER): Payer: No Typology Code available for payment source | Admitting: Family Medicine

## 2019-08-11 VITALS — BP 131/87 | HR 96 | Temp 97.8°F | Resp 18 | Wt 124.8 lb

## 2019-08-11 DIAGNOSIS — N923 Ovulation bleeding: Secondary | ICD-10-CM

## 2019-08-11 DIAGNOSIS — R1030 Lower abdominal pain, unspecified: Secondary | ICD-10-CM

## 2019-08-11 DIAGNOSIS — Z124 Encounter for screening for malignant neoplasm of cervix: Secondary | ICD-10-CM

## 2019-08-11 LAB — CBC
Absolute NRBC: 0 10*3/uL (ref 0.00–0.00)
Hematocrit: 38.4 % (ref 34.7–43.7)
Hgb: 12.6 g/dL (ref 11.4–14.8)
MCH: 30.4 pg (ref 25.1–33.5)
MCHC: 32.8 g/dL (ref 31.5–35.8)
MCV: 92.8 fL (ref 78.0–96.0)
MPV: 10.6 fL (ref 8.9–12.5)
Nucleated RBC: 0 /100 WBC (ref 0.0–0.0)
Platelets: 234 10*3/uL (ref 142–346)
RBC: 4.14 10*6/uL (ref 3.90–5.10)
RDW: 12 % (ref 11–15)
WBC: 6.32 10*3/uL (ref 3.10–9.50)

## 2019-08-11 LAB — POCT URINALYSIS AUTOMATED (IAH)
Bilirubin, UA POCT: NEGATIVE
Glucose, UA POCT: NEGATIVE
Ketones, UA POCT: NEGATIVE mg/dL
Nitrite, UA POCT: NEGATIVE
PH, UA POCT: 6 (ref 4.6–8)
Protein, UA POCT: NEGATIVE mg/dL
Specific Gravity, UA POCT: 1.02 mg/dL (ref 1.001–1.035)
Urine Leukocytes POCT: NEGATIVE
Urobilinogen, UA POCT: 0.2 mg/dL

## 2019-08-11 LAB — POCT PREGNANCY TEST, URINE HCG: POCT Pregnancy HCG Test, UR: NEGATIVE

## 2019-08-11 LAB — TSH: TSH: 1.16 u[IU]/mL (ref 0.35–4.94)

## 2019-08-11 NOTE — Progress Notes (Signed)
Have you seen any specialists/other providers since your last visit with [place]?    Yes, [facility]    Arm preference verified?   Yes    The patient is due for pap smear and influenza vaccine

## 2019-08-11 NOTE — Progress Notes (Signed)
Subjective:      Patient ID: [name] is a 28 y.o. female     Chief Complaint   Patient presents with   . Hematuria     x more 1 week ago        HPI   Last weds she had dysuria near end of stream went to UC on thurs, was given macrobid. She saw some blood in her urine of weekend. Sun/Mon she noted vaginal discharge - red, rust. Minimal amount. Today she had increased bleeding. More with straining. Not rectal. Not on OCP. LMP 07/30/19. Periods are regular. No injury. No concern for pregnancy. She has had some constipation since taking macrobid.     She no longer has dysuria. Mild abd pain, cramping. No vaginal pain. No fever.     States overdue for pap, last done 2017.    The following sections were reviewed this encounter by the provider:   Allergies  Meds  Problems         Review of Systems   No trouble swallowing, palpitations, SOB, breast discharge.       BP 131/87 (BP Site: Left arm, Patient Position: Sitting)   Pulse 96   Temp 97.8 F (36.6 C) (Oral)   Resp 18   Wt 56.6 kg (124 lb 12.8 oz)   LMP 07/30/2019   SpO2 98%   BMI 19.84 kg/m      Objective:     Physical Exam  Vitals signs reviewed.   Constitutional:       Appearance: Normal appearance.   Neck:      Comments: No thyromegaly  Cardiovascular:      Rate and Rhythm: Normal rate and regular rhythm.      Heart sounds: Normal heart sounds.   Pulmonary:      Effort: Pulmonary effort is normal. No respiratory distress.      Breath sounds: Normal breath sounds. No wheezing.   Abdominal:      General: Abdomen is flat. Bowel sounds are normal. There is no distension.      Tenderness: There is abdominal tenderness (mild lower abd).   Genitourinary:     Comments: Pelvic exam revealed old blood in vaginal vault.  With bearing down, patient did have cervical os bleeding.  Sample collected for Pap.  No discharge noted. Examination chaperoned by Vella Kohler.     Skin:     General: Skin is warm and dry.   Neurological:      Mental Status: She is  alert and oriented to person, place, and time.   Psychiatric:         Mood and Affect: Mood normal.         Behavior: Behavior normal.         Assessment:     1. Intermenstrual bleeding  - Obstetrics and Gynecology Referral: Horton Chin, MD [facility])  - POCT pregnancy, urine  - CBC without differential  - TSH    2. Lower abdominal pain  - POCT UA  Automated (urine dipstick)    3. Cervical cancer screening  - Pap Smear, Thin Prep w/ reflex to HR HPV        Plan:     Patient Instructions   As your periods are usually regular, we will get some additional blood work to try to find the source of this intermenstrual bleeding.  I have also put in a gynecology referral.  We got your Pap done today, as you are overdue.  Return for physical.      Terressa Koyanagi, MD

## 2019-08-11 NOTE — Patient Instructions (Signed)
As your periods are usually regular, we will get some additional blood work to try to find the source of this intermenstrual bleeding.  I have also put in a gynecology referral.  We got your Pap done today, as you are overdue.  Return for physical.

## 2019-08-15 ENCOUNTER — Encounter (INDEPENDENT_AMBULATORY_CARE_PROVIDER_SITE_OTHER): Payer: Self-pay | Admitting: Family Medicine

## 2019-08-15 ENCOUNTER — Encounter (INDEPENDENT_AMBULATORY_CARE_PROVIDER_SITE_OTHER): Payer: Self-pay

## 2019-08-15 NOTE — Progress Notes (Signed)
Thank you :)

## 2019-08-16 ENCOUNTER — Encounter (INDEPENDENT_AMBULATORY_CARE_PROVIDER_SITE_OTHER): Payer: Self-pay | Admitting: Obstetrics & Gynecology

## 2019-08-16 ENCOUNTER — Ambulatory Visit (INDEPENDENT_AMBULATORY_CARE_PROVIDER_SITE_OTHER): Payer: No Typology Code available for payment source | Admitting: Obstetrics & Gynecology

## 2019-08-16 VITALS — BP 127/85 | HR 92 | Temp 97.8°F | Ht 66.0 in | Wt 122.6 lb

## 2019-08-16 DIAGNOSIS — N926 Irregular menstruation, unspecified: Secondary | ICD-10-CM

## 2019-08-16 NOTE — Progress Notes (Signed)
Chief Complaint   Patient presents with   . Gynecologic Exam     NP. Irregular bleeding.        HPI:     [name] is a 28 y.o. female, No obstetric history on file., Patient's last menstrual period was 07/30/2019 (exact date). Here today for irregular bleeding mid cycle episode last Friday-Sunday .   Pt is  sexually active using condoms for contraception.     Pt was being treated for a UTI x 4 days  She then noticed brown discharge and then bright red blood x3 days   + some lower abdominal pain  + cramping   LMP 07/29/2019        GYN hx: [id number], no h/o STDs, no h/o abn pap smears    OB hx:   OB History   Gravida Para Term Preterm AB Living   0 0 0 0 0 0   SAB TAB Ectopic Multiple Live Births   0 0 0 0 0         PMH:     Past Medical History:   Diagnosis Date   . 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 BIOPSY, BREAST, TUMOR EXCISION, ULTRASOUND NEEDLE LOCALIZATION Left 02/24/2017    Procedure: BIOPSY, BREAST, TUMOR EXCISION, ULTRASOUND NEEDLE LOCALIZATION;  Surgeon: Tilda Burrow, MD;  Location: [facility];  Service: General;  Laterality: Left;  LEFT BREAST NLOC EXCISIONAL BIOPSY WITH INTRA-OP WIRE LOCALIZATION         Family History   Problem Relation Age of Onset   . No known problems Mother    . Lung cancer Father    . No known problems Sister    . No known problems Brother    . No known problems Sister    . No known problems Sister    . No known problems Sister    . Breast cancer Paternal Aunt 70   . Colon cancer Paternal Aunt    . Diabetes Maternal Grandfather    . Hypertension Neg Hx    . Ovarian cancer Neg Hx          SH:  Social History     Socioeconomic History   . Marital status: Married     Spouse name: Not on file   . Number of children: Not on file   . Years of education: Not on file   . Highest education level: Not on file   Occupational History   . Occupation: auditor   Social Needs   . Financial resource strain:  Not on file   . Food insecurity     Worry: Not on file     Inability: Not on file   . Transportation needs     Medical: Not on file     Non-medical: Not on file   Tobacco Use   . Smoking status: Never Smoker   . Smokeless tobacco: Never Used   Substance and Sexual Activity   . Alcohol use: Yes     Comment: 1-2 drinks per month (wine)   . Drug use: Never   . Sexual activity: Yes     Partners: Male     Birth control/protection: Condom   Lifestyle   . Physical activity     Days per week: Not on file     Minutes per session: Not on file   .  Stress: Not on file   Relationships   . Social Wellsite geologist on phone: Not on file     Gets together: Not on file     Attends religious service: Not on file     Active member of club or organization: Not on file     Attends meetings of clubs or organizations: Not on file     Relationship status: Not on file   . Intimate partner violence     Fear of current or ex partner: Not on file     Emotionally abused: Not on file     Physically abused: Not on file     Forced sexual activity: Not on file   Other Topics Concern   . Not on file   Social History Narrative   . Not on file         No Known Allergies      No current outpatient medications on file.        Review of Systems -   General ROS: negative for fatigue, fever/chills, weight loss  Breast ROS: negative for breast lumps, nipple discharge  Gastrointestinal ROS: no abdominal pain, change in bowel habits  Genitourinary ROS: no dysuria, trouble voiding, or hematuria  Skin: denies rash or lesion  Psych: denies depression  All other systems reviewed- negative      O:    BP 127/85   Pulse 92   Temp 97.8 F (36.6 C)   Ht 5\' 6"  (1.676 m)   Wt 122 lb 9.6 oz (55.6 kg)   LMP 07/30/2019 (Exact Date)   BMI 19.79 kg/m     General appearance - alert, well appearing, and in no distress  Abdomen - soft, nontender, nondistended, no masses  Extremities: no signs of clubbing or edema     Pelvic exam:   VULVA: normal appearing vulva with  no masses, tenderness or lesions, normal clitoris  VAGINA: normal appearing vagina with normal color and discharge, no lesions,  CERVIX: normal appearing cervix without discharge or lesions,  UTERUS: uterus is normal size, shape, consistency and nontender, anteverted  ADNEXA:  nontender and no masses.            A:  [name] was seen today for gynecologic exam.    Diagnoses and all orders for this visit:    Irregular bleeding  -     ([id number]) US Pelvic transvaginal; Future              Plan  - labs done by PCP r/w pt  Will order pelvic sono, if normal just monitor cycles  If abnl, then will d/w pt.  Could be mid cycle spotting due to drop in estrogen or recent stress on body.  D/w pt and reassured  All q's answered        -  Follow up after sono or prn.      - All questions answered AVS reviewed with the patient.    Diagnostic Results:   Prognosis: stable  Risks/Benefits of Treatment Options: Risks and benefits of therapy discussed with the patient.  See A&P  TOTAL TIME SPENT WITH PATIENT: 30 minutes  TIME SPENT COUNSELING/COORDINATING CARE: more than half the time was spent discussing diagnosis and treatment with the patient  >50% of total time spent with patient was regarding counseling/coordinating of care.    Orders Placed This Encounter   Procedures   . [phone]) US Pelvic transvaginal       Iseah Plouff  Benay Pike MD, [facility]

## 2019-08-18 LAB — PAP SMEAR, THIN PREP W/ REFLEX TO HR HPV

## 2019-08-23 ENCOUNTER — Ambulatory Visit (INDEPENDENT_AMBULATORY_CARE_PROVIDER_SITE_OTHER): Payer: No Typology Code available for payment source | Admitting: Family Medicine

## 2019-08-23 ENCOUNTER — Encounter (INDEPENDENT_AMBULATORY_CARE_PROVIDER_SITE_OTHER): Payer: Self-pay | Admitting: Family Medicine

## 2019-08-23 ENCOUNTER — Ambulatory Visit
Admission: RE | Admit: 2019-08-23 | Discharge: 2019-08-23 | Disposition: A | Discharge status: Home / Self Care | Payer: No Typology Code available for payment source | Source: Ambulatory Visit | Attending: Obstetrics & Gynecology | Admitting: Obstetrics & Gynecology

## 2019-08-23 VITALS — BP 125/78 | HR 88 | Temp 97.2°F | Ht 66.0 in | Wt 122.0 lb

## 2019-08-23 DIAGNOSIS — Z0000 Encounter for general adult medical examination without abnormal findings: Secondary | ICD-10-CM

## 2019-08-23 DIAGNOSIS — N926 Irregular menstruation, unspecified: Secondary | ICD-10-CM | POA: Insufficient documentation

## 2019-08-23 LAB — LIPID PANEL
Cholesterol / HDL Ratio: 2.8
Cholesterol: 157 mg/dL (ref 0–199)
HDL: 56 mg/dL (ref 40–9999)
LDL Calculated: 91 mg/dL (ref 0–99)
Triglycerides: 50 mg/dL (ref 34–149)
VLDL Calculated: 10 mg/dL (ref 10–40)

## 2019-08-23 LAB — COMPREHENSIVE METABOLIC PANEL
ALT: 6 U/L (ref 0–55)
AST (SGOT): 10 U/L (ref 5–34)
Albumin/Globulin Ratio: 1.8 (ref 0.9–2.2)
Albumin: 4.1 g/dL (ref 3.5–5.0)
Alkaline Phosphatase: 50 U/L (ref 37–106)
Anion Gap: 9 (ref 5.0–15.0)
BUN: 14 mg/dL (ref 7.0–19.0)
Bilirubin, Total: 0.3 mg/dL (ref 0.2–1.2)
CO2: 25 mEq/L (ref 21–29)
Calcium: 8.8 mg/dL (ref 8.5–10.5)
Chloride: 109 mEq/L (ref 100–111)
Creatinine: 0.8 mg/dL (ref 0.4–1.5)
Globulin: 2.3 g/dL (ref 2.0–3.7)
Glucose: 83 mg/dL (ref 70–100)
Potassium: 4.4 mEq/L (ref 3.5–5.1)
Protein, Total: 6.4 g/dL (ref 6.0–8.3)
Sodium: 143 mEq/L (ref 136–145)

## 2019-08-23 LAB — GFR: EGFR: >60

## 2019-08-23 LAB — HEMOLYSIS INDEX: Hemolysis Index: 3 (ref 0–18)

## 2019-08-23 NOTE — Progress Notes (Signed)
Subjective:      Patient ID: [name] is a 28 y.o. female.    Chief Complaint:  Chief Complaint   Patient presents with   . Annual Exam     pt is fasting       HPI:  HPI   Diet - breakfast - waffles and syrup, sometimes omelet with veg and [place]. Lunch - sandwich - [place] and chips. Sometimes has veg with lunch. Dinner - meat/veg/starch or Radiation protection practitioner. Not a lot of dairy. Drink - water, sugar free ginger ale, sometimes tea.   Exercise - not regular, 1x/few weeks. Better before pandemic.   Sleep - generally good 8-9 hours.   Stress - moderate.   Mood - good    Pap 2020  Td 2018  Hep A - had 1 shot, wants to see if she is going to travel again for 2nd  Flu - not yet, encouraged    Problem List:  There is no problem list on file for this patient.      Current Medications:  No current outpatient medications on file.     No current facility-administered medications for this visit.        Allergies:  No Known Allergies    Past Medical History:  Past Medical History:   Diagnosis Date   . 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 BIOPSY, BREAST, TUMOR EXCISION, ULTRASOUND NEEDLE LOCALIZATION Left 02/24/2017    Procedure: BIOPSY, BREAST, TUMOR EXCISION, ULTRASOUND NEEDLE LOCALIZATION;  Surgeon: Tilda Burrow, MD;  Location: [facility];  Service: General;  Laterality: Left;  LEFT BREAST NLOC EXCISIONAL BIOPSY WITH INTRA-OP WIRE LOCALIZATION       Family History:  Family History   Problem Relation Age of Onset   . No known problems Mother    . Lung cancer Father    . No known problems Sister    . No known problems Brother    . No known problems Sister    . No known problems Sister    . No known problems Sister    . Breast cancer Paternal Aunt 70   . Colon cancer Paternal Aunt    . Diabetes Maternal Grandfather    . Hypertension Neg Hx    . Ovarian cancer Neg Hx        Social History:  Social History     Socioeconomic History    . Marital status: Married     Spouse name: Not on file   . Number of children: Not on file   . Years of education: Not on file   . Highest education level: Not on file   Occupational History   . Occupation: auditor   Social Needs   . Financial resource strain: Not on file   . Food insecurity     Worry: Not on file     Inability: Not on file   . Transportation needs     Medical: Not on file     Non-medical: Not on file   Tobacco Use   . Smoking status: Never Smoker   . Smokeless tobacco: Never Used   Substance and Sexual Activity   . Alcohol use: Yes     Comment: 1-2 drinks per month (wine)   . Drug use: Never   . Sexual activity: Yes     Partners: Male     Birth control/protection:  Condom   Lifestyle   . Physical activity     Days per week: Not on file     Minutes per session: Not on file   . Stress: Not on file   Relationships   . Social Wellsite geologist on phone: Not on file     Gets together: Not on file     Attends religious service: Not on file     Active member of club or organization: Not on file     Attends meetings of clubs or organizations: Not on file     Relationship status: Not on file   . Intimate partner violence     Fear of current or ex partner: Not on file     Emotionally abused: Not on file     Physically abused: Not on file     Forced sexual activity: Not on file   Other Topics Concern   . Not on file   Social History Narrative   . Not on file       The following sections were reviewed this encounter by the provider:   Tobacco  Allergies  Meds  Problems  Med Hx  Surg Hx  Fam Hx  Soc Hx          ROS:  Review of Systems   Constitutional: Negative for chills and fever.   HENT: Negative for congestion. Dental problem: has appt with dentist upcoming.    Eyes: Visual disturbance: due for eye appt.   Respiratory: Negative for cough.    Cardiovascular: Negative for chest pain.   Gastrointestinal: Negative for abdominal pain.   Endocrine: Negative for cold intolerance and heat intolerance.    Genitourinary: Negative for dysuria.   Skin: Negative for rash.   Allergic/Immunologic: Negative for environmental allergies and food allergies.   Neurological: Negative for headaches.   Hematological: Does not bruise/bleed easily.   Psychiatric/Behavioral: Negative for dysphoric mood and sleep disturbance.       Vitals:  BP 125/78   Pulse 88   Temp 97.2 F (36.2 C) (Temporal)   Ht 1.676 m (5\' 6" )   Wt 55.3 kg (122 lb)   LMP 07/30/2019 (Exact Date)   SpO2 99%   BMI 19.69 kg/m      Objective:     Physical Exam:  Physical Exam  Vitals signs and nursing note reviewed.   Constitutional:       Appearance: She is well-developed.   HENT:      Head: Normocephalic and atraumatic.      Right Ear: Hearing, tympanic membrane, ear canal and external ear normal.      Left Ear: Hearing, tympanic membrane, ear canal and external ear normal.      Mouth/Throat:      Mouth: Mucous membranes are moist.   Eyes:      Pupils: Pupils are equal, round, and reactive to light.   Cardiovascular:      Rate and Rhythm: Normal rate and regular rhythm.      Heart sounds: Normal heart sounds. No murmur. No friction rub. No gallop.    Pulmonary:      Effort: Pulmonary effort is normal. No respiratory distress.      Breath sounds: Normal breath sounds. No wheezing or rales.   Abdominal:      General: Bowel sounds are normal. There is no distension.      Palpations: Abdomen is soft.      Tenderness: There is no abdominal tenderness.  Musculoskeletal: Normal range of motion.   Lymphadenopathy:      Cervical: No cervical adenopathy.   Skin:     General: Skin is warm and dry.   Neurological:      Mental Status: She is alert and oriented to person, place, and time.   Psychiatric:         Behavior: Behavior normal.          Assessment:     1. Routine general medical examination at a health care facility  - Lipid panel  - Comprehensive metabolic panel      Plan:     Patient Instructions   We did your physical today. We talked about diet and  exercise.  Please get your blood work done fasting.          Terressa Koyanagi, MD

## 2019-08-23 NOTE — Progress Notes (Signed)
Have you seen any specialists/other providers since your last visit with [place]?    Yes    gyno    Arm preference verified?   Yes    The patient is due for nothing at this time, HM is up-to-date.

## 2019-08-23 NOTE — Patient Instructions (Signed)
We did your physical today. We talked about diet and exercise.  Please get your blood work done fasting.

## 2019-08-24 ENCOUNTER — Encounter (INDEPENDENT_AMBULATORY_CARE_PROVIDER_SITE_OTHER): Payer: Self-pay | Admitting: Obstetrics & Gynecology

## 2019-09-15 ENCOUNTER — Encounter (INDEPENDENT_AMBULATORY_CARE_PROVIDER_SITE_OTHER): Payer: Self-pay

## 2019-09-19 ENCOUNTER — Encounter (INDEPENDENT_AMBULATORY_CARE_PROVIDER_SITE_OTHER): Payer: No Typology Code available for payment source | Admitting: Obstetrics & Gynecology

## 2019-10-16 ENCOUNTER — Encounter (INDEPENDENT_AMBULATORY_CARE_PROVIDER_SITE_OTHER): Payer: Self-pay

## 2019-11-11 ENCOUNTER — Encounter (INDEPENDENT_AMBULATORY_CARE_PROVIDER_SITE_OTHER): Payer: Self-pay

## 2019-11-12 ENCOUNTER — Encounter (INDEPENDENT_AMBULATORY_CARE_PROVIDER_SITE_OTHER): Payer: Self-pay

## 2019-11-13 ENCOUNTER — Encounter (INDEPENDENT_AMBULATORY_CARE_PROVIDER_SITE_OTHER): Payer: Self-pay

## 2019-12-11 ENCOUNTER — Encounter (INDEPENDENT_AMBULATORY_CARE_PROVIDER_SITE_OTHER): Payer: Self-pay

## 2019-12-13 ENCOUNTER — Encounter (INDEPENDENT_AMBULATORY_CARE_PROVIDER_SITE_OTHER): Payer: Self-pay

## 2019-12-14 ENCOUNTER — Encounter (INDEPENDENT_AMBULATORY_CARE_PROVIDER_SITE_OTHER): Payer: Self-pay

## 2019-12-22 ENCOUNTER — Other Ambulatory Visit (INDEPENDENT_AMBULATORY_CARE_PROVIDER_SITE_OTHER): Payer: Self-pay | Admitting: Family Medicine

## 2020-01-13 ENCOUNTER — Encounter (INDEPENDENT_AMBULATORY_CARE_PROVIDER_SITE_OTHER): Payer: Self-pay

## 2020-01-29 ENCOUNTER — Encounter (INDEPENDENT_AMBULATORY_CARE_PROVIDER_SITE_OTHER): Payer: Self-pay | Admitting: Family Medicine

## 2020-02-13 ENCOUNTER — Encounter (INDEPENDENT_AMBULATORY_CARE_PROVIDER_SITE_OTHER): Payer: Self-pay

## 2020-03-14 ENCOUNTER — Encounter (INDEPENDENT_AMBULATORY_CARE_PROVIDER_SITE_OTHER): Payer: Self-pay

## 2020-04-14 ENCOUNTER — Encounter (INDEPENDENT_AMBULATORY_CARE_PROVIDER_SITE_OTHER): Payer: Self-pay

## 2020-04-24 ENCOUNTER — Encounter (INDEPENDENT_AMBULATORY_CARE_PROVIDER_SITE_OTHER): Payer: Self-pay | Admitting: Family Medicine

## 2020-04-26 ENCOUNTER — Encounter (INDEPENDENT_AMBULATORY_CARE_PROVIDER_SITE_OTHER): Payer: Self-pay | Admitting: Family Medicine

## 2020-04-26 ENCOUNTER — Ambulatory Visit (INDEPENDENT_AMBULATORY_CARE_PROVIDER_SITE_OTHER): Payer: No Typology Code available for payment source | Admitting: Family Medicine

## 2020-04-26 VITALS — BP 131/82 | HR 76 | Temp 98.4°F | Wt 124.0 lb

## 2020-04-26 DIAGNOSIS — B354 Tinea corporis: Secondary | ICD-10-CM

## 2020-04-26 MED ORDER — CLOTRIMAZOLE 1 % EX OINT
1.00 | TOPICAL_OINTMENT | Freq: Two times a day (BID) | CUTANEOUS | 0 refills | Status: DC
Start: 2020-04-26 — End: 2020-08-23

## 2020-04-26 NOTE — Progress Notes (Signed)
Have you seen any specialists/other providers since your last visit with us?    No    Arm preference verified?   Yes, no preference    Health Maintenance Due   Topic Date Due    COVID-19 Vaccine (1) Never done    INFLUENZA VACCINE  Never done

## 2020-04-26 NOTE — Progress Notes (Signed)
Subjective:      Patient ID: [name] is a 29 y.o. female     Chief Complaint   Patient presents with   . Possible fungal Infxn Right axila        HPI   She noticed the rash on Monday. No change. No itching. No meds used. No one else with rash. Not anywhere else.     The following sections were reviewed this encounter by the provider:   Allergies  Meds  Problems         Review of Systems       BP 131/82 (BP Site: Left arm, Patient Position: Sitting, Cuff Size: Medium)   Pulse 76   Temp 98.4 F (36.9 C) (Oral)   Wt 56.2 kg (124 lb)   LMP 04/04/2020 (Exact Date)   BMI 20.01 kg/m      Objective:     Physical Exam  Vitals reviewed.   Constitutional:       Appearance: Normal appearance.   Pulmonary:      Effort: Pulmonary effort is normal.   Skin:     Comments: Irregular hyperpigmented macule under right axilla.  No flaking.  No illumination under Woods lamp.   Neurological:      Mental Status: She is alert and oriented to person, place, and time.   Psychiatric:         Mood and Affect: Mood normal.         Behavior: Behavior normal.         Assessment:     1. Ringworm of body  - Clotrimazole 1 % Ointment; Apply 1 application topically 2 (two) times daily  Dispense: 15 g; Refill: 0        Plan:     Patient Instructions   We talked about your rash today.  Lets try with an antifungal, and let me know how it evolves.      Terressa Koyanagi, MD

## 2020-04-26 NOTE — Patient Instructions (Signed)
We talked about your rash today.  Lets try with an antifungal, and let me know how it evolves.

## 2020-05-09 ENCOUNTER — Other Ambulatory Visit (FREE_STANDING_LABORATORY_FACILITY): Payer: No Typology Code available for payment source

## 2020-05-09 ENCOUNTER — Ambulatory Visit (INDEPENDENT_AMBULATORY_CARE_PROVIDER_SITE_OTHER): Payer: No Typology Code available for payment source | Admitting: Obstetrics & Gynecology

## 2020-05-09 ENCOUNTER — Encounter (INDEPENDENT_AMBULATORY_CARE_PROVIDER_SITE_OTHER): Payer: Self-pay | Admitting: Obstetrics & Gynecology

## 2020-05-09 VITALS — BP 125/85 | HR 109 | Resp 18 | Ht 66.0 in | Wt 123.0 lb

## 2020-05-09 DIAGNOSIS — Z3169 Encounter for other general counseling and advice on procreation: Secondary | ICD-10-CM

## 2020-05-09 LAB — HEMOGLOBIN A1C
Average Estimated Glucose: 85.3 mg/dL
Hemoglobin A1C: 4.6 % (ref 4.6–5.9)

## 2020-05-09 LAB — HEPATITIS C ANTIBODY: Hepatitis C, AB: NONREACTIVE

## 2020-05-09 LAB — RUBELLA ANTIBODY, IGG: Rubella AB, IgG: 8.19

## 2020-05-09 LAB — TSH: TSH: 0.81 u[IU]/mL (ref 0.35–4.94)

## 2020-05-09 NOTE — Progress Notes (Signed)
HPI:     [name] is a 29 y.o. female, G0P0000, Patient's last menstrual period was 05/01/2020 (exact date). Here today for preconception counseling  She reports regular menses. She is married and her husband has no medical issues . She is currently using condoms for contraception.      she is planning to conceive in 6-12 months.    no family hx of sickle cell dz or mental retardation.         GYN hx:  No  h/o abn pap smears    OB hx: G0P0000,    PMH:     Past Medical History:   Diagnosis Date   . 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 BIOPSY, BREAST, TUMOR EXCISION, ULTRASOUND NEEDLE LOCALIZATION Left 02/24/2017    Procedure: BIOPSY, BREAST, TUMOR EXCISION, ULTRASOUND NEEDLE LOCALIZATION;  Surgeon: Tilda Burrow, MD;  Location: [facility];  Service: General;  Laterality: Left;  LEFT BREAST NLOC EXCISIONAL BIOPSY WITH INTRA-OP WIRE LOCALIZATION         Family History   Problem Relation Age of Onset   . No known problems Mother    . Lung cancer Father    . No known problems Sister    . No known problems Brother    . No known problems Sister    . No known problems Sister    . No known problems Sister    . Breast cancer Paternal Aunt 57   . Colon cancer Paternal Aunt    . Diabetes Maternal Grandfather    . Hypertension Neg Hx    . Ovarian cancer Neg Hx          SH:  Social History     Socioeconomic History   . Marital status: Married     Spouse name: Not on file   . Number of children: Not on file   . Years of education: Not on file   . Highest education level: Not on file   Occupational History   . Occupation: auditor   Tobacco Use   . Smoking status: Never Smoker   . Smokeless tobacco: Never Used   Substance and Sexual Activity   . Alcohol use: Yes     Comment: 1-2 drinks per month (wine)   . Drug use: Never   . Sexual activity: Yes     Partners: Male     Birth control/protection: Condom   Other Topics Concern   . Not on file    Social History Narrative   . Not on file     Social Determinants of Health     Financial Resource Strain:    . Difficulty of Paying Living Expenses:    Food Insecurity:    . Worried About Programme researcher, broadcasting/film/video in the Last Year:    . Barista in the Last Year:    Transportation Needs:    . Freight forwarder (Medical):    Marland Kitchen Lack of Transportation (Non-Medical):    Physical Activity:    . Days of Exercise per Week:    . Minutes of Exercise per Session:    Stress:    . Feeling of Stress :    Social Connections:    . Frequency of Communication with Friends and Family:    . Frequency of Social Gatherings with Friends and Family:    .  Attends Religious Services:    . Active Member of Clubs or Organizations:    . Attends Banker Meetings:    Marland Kitchen Marital Status:    Intimate Partner Violence:    . Fear of Current or Ex-Partner:    . Emotionally Abused:    Marland Kitchen Physically Abused:    . Sexually Abused:          No Known Allergies        Current Outpatient Medications:   .  Clotrimazole 1 % Ointment, Apply 1 application topically 2 (two) times daily, Disp: 15 g, Rfl: 0        Review of Systems -  General ROS: negative for fatigue, fever/chills, weight loss  Breast ROS: negative for breast lumps, nipple discharge  Gastrointestinal ROS: no abdominal pain, change in bowel habits  Genitourinary ROS: no dysuria, trouble voiding, or hematuria  Skin: denies rash or lesion  Psych: denies depression  All other systems reviewed- negative      O:    BP 125/85 (BP Site: Left arm, Patient Position: Sitting)   Pulse (!) 109   Resp 18   Ht 5\' 6"  (1.676 m)   Wt 123 lb (55.8 kg)   LMP 05/01/2020 (Exact Date)   BMI 19.85 kg/m     General appearance - alert, well appearing, and in no distress  Extremities: no signs of clubbing or edema       A:  1. Encounter for preconception consultation             Plan  -we discussed menstrual cycles, mid cycle ovulation   discussed normal BMI  Reviewed previous labs.    start  prenatal vitamins  Will obtained sickle cell screening, TSH and hemoglobin A1c  -  Follow up as needed  - All questions answered AVS reviewed with the patient.     All questions were answered. A total of 25  minutes were spent face-to-face with the patient during this encounter and over 50% of that time was spent on counseling and coordination of care. We discussed in depth: recommended diagnostic studies , test results , treatment options , instructions to patient and/or family.  No orders of the defined types were placed in this encounter.      Horton Chin MD

## 2020-05-14 LAB — HEMOGLOBINOPATHY EVALUATION W/O HEMOGRAM
Hemoglobin A2: 2.2 % (ref 2.2–3.2)
Hemoglobin A: 97.8 % (ref >96.0)
Hemoglobin F: 0 % (ref <2.0)

## 2020-05-15 ENCOUNTER — Encounter (INDEPENDENT_AMBULATORY_CARE_PROVIDER_SITE_OTHER): Payer: Self-pay

## 2020-06-14 ENCOUNTER — Encounter (INDEPENDENT_AMBULATORY_CARE_PROVIDER_SITE_OTHER): Payer: Self-pay

## 2020-07-15 ENCOUNTER — Encounter (INDEPENDENT_AMBULATORY_CARE_PROVIDER_SITE_OTHER): Payer: Self-pay

## 2020-08-12 ENCOUNTER — Encounter (INDEPENDENT_AMBULATORY_CARE_PROVIDER_SITE_OTHER): Payer: Self-pay | Admitting: Family Medicine

## 2020-08-14 ENCOUNTER — Encounter (INDEPENDENT_AMBULATORY_CARE_PROVIDER_SITE_OTHER): Payer: Self-pay

## 2020-08-23 ENCOUNTER — Ambulatory Visit (INDEPENDENT_AMBULATORY_CARE_PROVIDER_SITE_OTHER): Payer: No Typology Code available for payment source | Admitting: Family Medicine

## 2020-08-23 ENCOUNTER — Encounter (INDEPENDENT_AMBULATORY_CARE_PROVIDER_SITE_OTHER): Payer: Self-pay | Admitting: Family Medicine

## 2020-08-23 VITALS — BP 122/84 | HR 94 | Temp 97.9°F | Resp 17 | Ht 65.75 in | Wt 122.0 lb

## 2020-08-23 DIAGNOSIS — Z0001 Encounter for general adult medical examination with abnormal findings: Secondary | ICD-10-CM

## 2020-08-23 DIAGNOSIS — N6310 Unspecified lump in the right breast, unspecified quadrant: Secondary | ICD-10-CM

## 2020-08-23 DIAGNOSIS — Z0000 Encounter for general adult medical examination without abnormal findings: Secondary | ICD-10-CM

## 2020-08-23 DIAGNOSIS — N63 Unspecified lump in unspecified breast: Secondary | ICD-10-CM

## 2020-08-23 DIAGNOSIS — N926 Irregular menstruation, unspecified: Secondary | ICD-10-CM

## 2020-08-23 DIAGNOSIS — Z3201 Encounter for pregnancy test, result positive: Secondary | ICD-10-CM

## 2020-08-23 DIAGNOSIS — Z23 Encounter for immunization: Secondary | ICD-10-CM

## 2020-08-23 LAB — POCT PREGNANCY TEST, URINE HCG: POCT Pregnancy HCG Test, UR: POSITIVE — AB

## 2020-08-23 NOTE — Patient Instructions (Addendum)
We did your physical today. We talked about diet and exercise.  Please get your ultrasound done. I gave you a list of psych resources.

## 2020-08-23 NOTE — Progress Notes (Signed)
Have you seen any specialists/other providers since your last visit with us?    No    Arm preference verified?   Yes    The patient is due for influenza vaccine tolerated well, no pain nor discomfort noted. Left in good condition.

## 2020-08-23 NOTE — Progress Notes (Signed)
Subjective:      Patient ID: [name] is a 29 y.o. female.    Chief Complaint:  Chief Complaint   Patient presents with   . Annual Exam     not fast       HPI:  HPI   Diet - breakfast - yogurt/granola/smoothie/eggs with spinach. Lunch - salads/leftovers. Dinner - homecooked or takeout. Has veg with dinner. Sweets - occ.   Exercise - walking 15-20 min few times a week.   Sleep - 8 hours, 4-6 hours lately since pregnancy. She wakes and goes back to sleep or takes a nap. No snoring. Sleeps at 1030-7-830. May wake when husband wakes up then goes back to sleep.   Stress - work stressful, some anxiety. She is looking for a therapist.   Mood - fine    covid done  Flu - today  Pap, Td utd    Reviewed previous BW, declined today.    2 weeks noticed R breast lump, no size change, no rash or tenderness.     Problem List:  There is no problem list on file for this patient.      Current Medications:  Current Outpatient Medications   Medication Sig Dispense Refill   . Prenatal Vit-DSS-Fe Cbn-FA (PRENATAL AD PO) Take by mouth       No current facility-administered medications for this visit.       Allergies:  No Known Allergies    Past Medical History:  Past Medical History:   Diagnosis Date   . 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 BIOPSY, BREAST, TUMOR EXCISION, ULTRASOUND NEEDLE LOCALIZATION Left 02/24/2017    Procedure: BIOPSY, BREAST, TUMOR EXCISION, ULTRASOUND NEEDLE LOCALIZATION;  Surgeon: Tilda Burrow, MD;  Location: [facility];  Service: General;  Laterality: Left;  LEFT BREAST NLOC EXCISIONAL BIOPSY WITH INTRA-OP WIRE LOCALIZATION       Family History:  Family History   Problem Relation Age of Onset   . No known problems Mother    . Lung cancer Father    . No known problems Sister    . No known problems Brother    . No known problems Sister    . No known problems Sister    . No known problems Sister    . Breast cancer  Paternal Aunt 25   . Colon cancer Paternal Aunt    . Diabetes Maternal Grandfather    . Hypertension Neg Hx    . Ovarian cancer Neg Hx        Social History:  Social History     Socioeconomic History   . Marital status: Married   Occupational History   . Occupation: auditor   Tobacco Use   . Smoking status: Never Smoker   . Smokeless tobacco: Never Used   Substance and Sexual Activity   . Alcohol use: Yes     Alcohol/week: 1.0 - 2.0 standard drink     Types: 1 - 2 Glasses of wine per week     Comment: 1-2 drinks per month (wine)weekly   . Drug use: Never   . Sexual activity: Yes     Partners: Male     Comment: pregnant     Social Determinants of Health     Financial Resource Strain: Low Risk    . Difficulty of Paying Living Expenses: Not hard at all  Food Insecurity: No Food Insecurity   . Worried About Programme researcher, broadcasting/film/video in the Last Year: Never true   . Ran Out of Food in the Last Year: Never true   Transportation Needs: No Transportation Needs   . Lack of Transportation (Medical): No   . Lack of Transportation (Non-Medical): No   Physical Activity: Insufficiently Active   . Days of Exercise per Week: 3 days   . Minutes of Exercise per Session: 30 min   Stress: Stress Concern Present   . Feeling of Stress : To some extent   Social Connections: Moderately Integrated   . Frequency of Communication with Friends and Family: More than three times a week   . Frequency of Social Gatherings with Friends and Family: Once a week   . Attends Religious Services: More than 4 times per year   . Active Member of Clubs or Organizations: No   . Attends Banker Meetings: Patient refused   . Marital Status: Married   Catering manager Violence: Not At Risk   . Fear of Current or Ex-Partner: No   . Emotionally Abused: No   . Physically Abused: No   . Sexually Abused: No       The following sections were reviewed this encounter by the provider:   Tobacco  Allergies  Meds  Problems  Med Hx  Surg Hx  Fam Hx  Soc Hx           ROS:  Review of Systems   Constitutional: Negative for chills and fever.   HENT: Negative for congestion. Dental problem: sees dentist.    Eyes: Visual disturbance: saw eye dr 1-2 yrs ago, due?   Respiratory: Negative for cough.    Cardiovascular: Negative for chest pain.   Gastrointestinal: Negative for abdominal pain.   Genitourinary: Negative for dysuria.   Skin: Negative for rash.   Neurological: Negative for headaches.   Hematological: Does not bruise/bleed easily.       Vitals:  BP 122/84   Pulse 94   Temp 97.9 F (36.6 C) (Tympanic)   Resp 17   Ht 1.67 m (5' 5.75")   Wt 55.3 kg (122 lb)   LMP 07/07/2020   SpO2 99%   BMI 19.84 kg/m      Objective:     Physical Exam:  Physical Exam  Vitals and nursing note reviewed.   Constitutional:       Appearance: She is well-developed.   HENT:      Head: Normocephalic and atraumatic.      Right Ear: Hearing, tympanic membrane, ear canal and external ear normal.      Left Ear: Hearing, tympanic membrane, ear canal and external ear normal.      Mouth/Throat:      Mouth: Mucous membranes are moist.   Eyes:      Pupils: Pupils are equal, round, and reactive to light.   Cardiovascular:      Rate and Rhythm: Normal rate and regular rhythm.      Heart sounds: Normal heart sounds. No murmur heard.  No friction rub. No gallop.    Pulmonary:      Effort: Pulmonary effort is normal. No respiratory distress.      Breath sounds: Normal breath sounds. No wheezing or rales.   Chest:   Breasts:      Right: Mass present. No swelling, bleeding, inverted nipple, nipple discharge, skin change or tenderness.      Left: Normal. No swelling,  bleeding, inverted nipple, mass, nipple discharge, skin change or tenderness.            Comments: Declined chaperone  Abdominal:      General: Bowel sounds are normal. There is no distension.      Palpations: Abdomen is soft.      Tenderness: There is no abdominal tenderness.   Musculoskeletal:         General: Normal range of motion.    Lymphadenopathy:      Cervical: No cervical adenopathy.   Skin:     General: Skin is warm and dry.   Neurological:      Mental Status: She is alert and oriented to person, place, and time.   Psychiatric:         Behavior: Behavior normal.          Assessment:     1. Routine general medical examination at a health care facility    2. Needs flu shot  - Flu vaccine QUADRIVALENT (PF) 6 months and older (FLULAVAL/FLUARIX)    3. Breast lump  - US Breast Right Ltd; Future    4. Missed period  - POCT pregnancy, urine      Plan:     Patient Instructions   We did your physical today. We talked about diet and exercise.  Please get your ultrasound done. I gave you a list of psych resources.           Terressa Koyanagi, MD

## 2020-08-25 ENCOUNTER — Emergency Department
Admission: EM | Admit: 2020-08-25 | Discharge: 2020-08-25 | Disposition: A | Discharge status: Home / Self Care | Payer: No Typology Code available for payment source | Attending: Emergency Medicine | Admitting: Emergency Medicine

## 2020-08-25 ENCOUNTER — Emergency Department: Payer: No Typology Code available for payment source

## 2020-08-25 DIAGNOSIS — O209 Hemorrhage in early pregnancy, unspecified: Secondary | ICD-10-CM | POA: Insufficient documentation

## 2020-08-25 DIAGNOSIS — Z3A01 Less than 8 weeks gestation of pregnancy: Secondary | ICD-10-CM | POA: Insufficient documentation

## 2020-08-25 DIAGNOSIS — O200 Threatened abortion: Secondary | ICD-10-CM | POA: Insufficient documentation

## 2020-08-25 DIAGNOSIS — Z139 Encounter for screening, unspecified: Secondary | ICD-10-CM

## 2020-08-25 LAB — BASIC METABOLIC PANEL
Anion Gap: 9 (ref 5.0–15.0)
BUN: 10 mg/dL (ref 7–19)
CO2: 20 mEq/L — ABNORMAL LOW (ref 22–29)
Calcium: 9.8 mg/dL (ref 8.5–10.5)
Chloride: 109 mEq/L (ref 100–111)
Creatinine: 0.8 mg/dL (ref 0.6–1.0)
Glucose: 91 mg/dL (ref 70–100)
Potassium: 4.2 mEq/L (ref 3.5–5.1)
Sodium: 138 mEq/L (ref 136–145)

## 2020-08-25 LAB — CBC AND DIFFERENTIAL
Absolute NRBC: 0 10*3/uL (ref 0.00–0.00)
Basophils Absolute Automated: 0.03 10*3/uL (ref 0.00–0.08)
Basophils Automated: 0.6 %
Eosinophils Absolute Automated: 0.05 10*3/uL (ref 0.00–0.44)
Eosinophils Automated: 1.1 %
Hematocrit: 39.1 % (ref 34.7–43.7)
Hgb: 13.3 g/dL (ref 11.4–14.8)
Immature Granulocytes Absolute: 0.01 10*3/uL (ref 0.00–0.07)
Immature Granulocytes: 0.2 %
Lymphocytes Absolute Automated: 1.18 10*3/uL (ref 0.42–3.22)
Lymphocytes Automated: 25.2 %
MCH: 30.9 pg (ref 25.1–33.5)
MCHC: 34 g/dL (ref 31.5–35.8)
MCV: 90.9 fL (ref 78.0–96.0)
MPV: 10 fL (ref 8.9–12.5)
Monocytes Absolute Automated: 0.68 10*3/uL (ref 0.21–0.85)
Monocytes: 14.5 %
Neutrophils Absolute: 2.73 10*3/uL (ref 1.10–6.33)
Neutrophils: 58.4 %
Nucleated RBC: 0 /100 WBC (ref 0.0–0.0)
Platelets: 223 10*3/uL (ref 142–346)
RBC: 4.3 10*6/uL (ref 3.90–5.10)
RDW: 13 % (ref 11–15)
WBC: 4.68 10*3/uL (ref 3.10–9.50)

## 2020-08-25 LAB — HCG QUANTITATIVE: hCG, Quant.: 337.2

## 2020-08-25 LAB — ABO/RH: ABO Rh: O POS

## 2020-08-25 LAB — GFR: EGFR: >60

## 2020-08-25 MED ORDER — SODIUM CHLORIDE 0.9 % IV BOLUS
1000.0000 mL | Freq: Once | INTRAVENOUS | Status: AC
Start: 2020-08-25 — End: 2020-08-25
  Administered 2020-08-25: 10:00:00 1000 mL via INTRAVENOUS

## 2020-08-25 MED ORDER — ACETAMINOPHEN 500 MG PO TABS
1000.0000 mg | ORAL_TABLET | Freq: Once | ORAL | Status: AC
Start: 2020-08-25 — End: 2020-08-25
  Administered 2020-08-25: 11:00:00 1000 mg via ORAL
  Filled 2020-08-25: qty 2

## 2020-08-25 NOTE — ED Triage Notes (Signed)
Pt aert and oriented, able to ambulate without difficulty, pt  c/o of cramping to bilateral lower abdomen, estiamted [redacted] weeks gestation, onset 1 day ago, denies vaginal pain, pt states light spotting, one small clot, dark red blood discharge from vagina, unable to provide # of pads per hour.  and deneis urinary frequency, no urinary burning, deneis fevers, denies nausea, denies vomiting and denies diarrhea. 11/31/2021, NKDA and no home meds Taken.

## 2020-08-25 NOTE — ED Triage Notes (Signed)
Patient is here for Medical Screening Exam. No charge for this visit.

## 2020-08-25 NOTE — Discharge Instructions (Signed)
Go to nearest ED for further evaluation

## 2020-08-25 NOTE — ED Provider Notes (Signed)
[facility]            History     Chief Complaint   Patient presents with   . Vaginal Bleeding     Pt with PMHx G1P0, currently ~[redacted] weeks pregnant, presents for evaluation of lower abdominal cramping and vaginal bleeding that started yesterday.  Patient notes yesterday bleeding was very bright red but spotting only when she wiped.  This morning when she went to the bathroom she notes it was more like the start of her period.  She notes she did pass 1 small clot.  She still complains of mild cramping.  She denies any other symptoms including no fever, nausea, vomiting, urinary complaints.  She has her first appointment with OB on January 5    PCP: Alphonsa Gin  GYN: Horton Chin    The history is provided by the patient and the spouse.   Vaginal Bleeding       Past Medical History:   Diagnosis Date   . 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 BIOPSY, BREAST, TUMOR EXCISION, ULTRASOUND NEEDLE LOCALIZATION Left 02/24/2017    Procedure: BIOPSY, BREAST, TUMOR EXCISION, ULTRASOUND NEEDLE LOCALIZATION;  Surgeon: Tilda Burrow, MD;  Location: [facility];  Service: General;  Laterality: Left;  LEFT BREAST NLOC EXCISIONAL BIOPSY WITH INTRA-OP WIRE LOCALIZATION       Family History   Problem Relation Age of Onset   . No known problems Mother    . Lung cancer Father    . No known problems Sister    . No known problems Brother    . No known problems Sister    . No known problems Sister    . No known problems Sister    . Breast cancer Paternal Aunt 43   . Colon cancer Paternal Aunt    . Diabetes Maternal Grandfather    . Hypertension Neg Hx    . Ovarian cancer Neg Hx        Social  Social History     Tobacco Use   . Smoking status: Never Smoker   . Smokeless tobacco: Never Used   Vaping Use   . Vaping Use: Never used   Substance Use Topics   . Alcohol use: Yes     Alcohol/week: 1.0 - 2.0 standard drink     Types: 1 - 2 Glasses of wine per week      Comment: 1-2 drinks per month (wine)weekly   . Drug use: Never       .     No Known Allergies    Home Medications     Med List Status: In Progress Set By: Doroteo Bradford, RN at 08/25/2020  9:19 AM                Prenatal Vit-DSS-Fe Cbn-FA (PRENATAL AD PO)     Take by mouth           Review of Systems   Genitourinary: Positive for vaginal bleeding.   All other systems reviewed and are negative.      Physical Exam    BP: 120/77, Heart Rate: 93, Temp: 98.4 F (36.9 C), Resp Rate: 18, SpO2: 100 %, Weight: 56.6 kg    Physical Exam  Vitals and nursing note reviewed.   Constitutional:       General: She is not in acute distress.     Appearance: Normal  appearance. She is well-developed and normal weight. She is not ill-appearing.   HENT:      Head: Normocephalic and atraumatic.   Cardiovascular:      Rate and Rhythm: Normal rate and regular rhythm.      Heart sounds: Normal heart sounds. No murmur heard.  No friction rub. No gallop.    Pulmonary:      Effort: Pulmonary effort is normal. No respiratory distress.      Breath sounds: Normal breath sounds. No wheezing or rales.   Abdominal:      General: Bowel sounds are normal. There is no distension.      Palpations: Abdomen is soft.      Tenderness: There is no abdominal tenderness. There is no guarding.   Musculoskeletal:         General: Normal range of motion.      Cervical back: Normal range of motion and neck supple.   Skin:     General: Skin is warm and dry.   Neurological:      Mental Status: She is alert.           MDM and ED Course     ED Medication Orders (From admission, onward)    Start Ordered     Status Ordering Provider    08/25/20 1127 08/25/20 1126  acetaminophen (TYLENOL) tablet 1,000 mg  Once        Route: Oral  Ordered Dose: 1,000 mg     Last MAR action: Given Domingo Pulse    08/25/20 1002 08/25/20 1001  sodium chloride 0.9 % bolus 1,000 mL  Once        Route: Intravenous  Ordered Dose: 1,000 mL     Last MAR action: Stopped Elen Acero L              MDM  Number of Diagnoses or Management Options  Threatened abortion  Diagnosis management comments:   Differential diagnosis: pregnancy, incomplete/threatened/missed abortion, ectopic pregnancy, retained products of conception, ovarian cyst,  Etc    Pt politely declines analgesics here.     Laboratory results reviewed by ED provider with patient and/or family:  Yes  Radiologic study results reviewed by ED provider with patient and/or family:  Yes    Re-eval: Patient with mild vaginal bleeding and cramping.  About [redacted] weeks pregnant per last menstrual period.  Beta hCG only 337.  Ultrasound with no IUP or ectopic.  Concerns for threatened abortion vs failed pregnancy.  Her vitals are stable.  Pain controlled.  Reviewed all blood work and/or urine and imaging results with patient and/or family, including abnormal findings.  She does have close follow-up with her OB next week.  Discussed treatment and need for close outpatient follow up with their PMD and/or referrals provided today to discuss these findings. Strict return precautions given. All questions and concerns addressed.      Return Precautions    The patient  and/or patient's family is aware that this evaluation is only a screening for emergent conditions related to his or her symptoms and presentation.   I discussed the need for prompt follow-up.  Patient and/or family demonstrates verbal understanding.  Patient and/or family advised to return to the ED for any worsening symptoms, uncontrolled pain if applicable, worsening fevers, or any changes in their condition prompting concern and need for repeat evaluationand/or additional management. Patient understands that they can return to the emergency department at any given time they are having difficulty with followup  or other complaints.     Discussed case with Dr. Ladona Ridgel.                    Procedures    Clinical Impression & Disposition     Clinical Impression  Final diagnoses:   Threatened  abortion        ED Disposition     ED Disposition Condition Date/Time Comment    Discharge  Sun Aug 25, 2020 12:22 PM [name] discharge to home/self care.    Condition at disposition: Stable           Discharge Medication List as of 08/25/2020 12:22 PM                    Christene Slates, PA  08/25/20 1237       Domingo Pulse, DO  08/25/20 1420

## 2020-08-25 NOTE — Discharge Instructions (Signed)
Dear [name],    You were seen today by Josph Macho, PA-C. Thank you for choosing the [facility] for your healthcare needs.  We hope your visit today was EXCELLENT.    FOLLOW UP WITH YOUR PRIMARY DOCTOR OR THE REFERRAL(S) PROVIDED TODAY FOR REPEAT EVALUATION AND TO DISCUSS ANY ABNORMAL TEST RESULTS. TAKE A COPY OF YOUR TEST RESULTS WITH YOU TO YOUR APPOINTMENT. RETURN TO THE ER IF YOU DEVELOP WORSENING SYMPTOMS, NEW CONCERNING SYMPTOMS, OR YOU ARE UNABLE TO SEE YOUR DOCTOR IN 2 DAYS.     Please take any medications prescribed as directed.     If you have any questions or concerns, I am available at [phone]. Please do not hesitate to contact me if I can be of assistance.     Below is some information and resources that our patients often find helpful.    Sincerely,    Josph Macho, Physician Assistant  [facility]    ________________________________________________________________  Thank you for choosing [facility] for your emergency care needs.  We strive to provide EXCELLENT care to you and your family.      If you do not continue to improve, your condition worsens, or you develop new concerning symptoms please contact your doctor or return immediately to the Emergency Department.    DOCTOR REFERRALS  Call [phone] (available 24 hours a day, 7 days a week) if you need any further referrals and we can help you find a primary care doctor or specialist.  Also, available online at:  [email or web address]    YOUR CONTACT INFORMATION  Before leaving please check with registration to make sure we have an up-to-date contact number.  You can call registration at [phone] to update your information.  For questions about your hospital bill, please call [phone].  For questions about your Emergency Dept Physician bill please call [phone].      FREE HEALTH SERVICES  If you need help  with health or social services, please call 2-1-1 for a free referral to resources in your area.  2-1-1 is a free service connecting people with information on health insurance, free clinics, pregnancy, mental health, dental care, food assistance, housing, and substance abuse counseling.  Also, available online at:  http://[email or web address].org    MEDICAL RECORDS AND TESTS  Certain laboratory test results do not come back the same day, for example urine cultures.  We will contact you if other important findings are noted. Radiology films are often reviewed again to ensure accuracy.  If there is any discrepancy, we will notify you.    Please call [phone] to pick up a complimentary CD of any radiology studies performed.  If you or your doctor would like to request a copy of your medical records, please call [phone].      ORTHOPEDIC INJURY   Please know that significant injuries can exist even when an initial x-ray is read as normal or negative.  This can occur because some fractures (broken bones) are not initially visible on x-rays or with soft tissue injuries.  For this reason, close outpatient follow-up with your primary care doctor or bone specialist (orthopedist) is required.    MEDICATIONS AND FOLLOWUP  Please be aware that some prescription medications can cause drowsiness.  Use caution when driving or operating machinery.    The examination and treatment you have received in our Emergency Department is provided on an emergency basis,  and is not intended to be a substitute for your primary care physician.  It is important that your doctor checks you again and that you report any new or remaining problems at that time.      [place], [place], [place] [address] (1.4 miles, 7 minutes)  [place], [place], [place] [address] (6.5 miles, 13 minutes)  Handout with directions available on request.

## 2020-08-25 NOTE — ED Provider Notes (Signed)
[facility] EMERGENCY DEPARTMENT H&P      Visit date: 08/25/2020      CLINICAL SUMMARY          Diagnosis:    .     Final diagnoses:   Encounter for medical screening examination         MDM Notes:        29 y/o female presenting with vaginal bleeding in early pregnancy  She is afebrile, normal vital signs  Discussed need for pelvic, blood work, [place] and she will go to nearest facility for evaluation.  She is stable to go by POV to nearest facility         Disposition:         Discharge        Discharge Prescriptions     None                         CLINICAL INFORMATION        HPI:      Chief Complaint: Abdominal Pain (lower abdominal cramping pain 5/10 with ten being the worst.)  .    [name] is a 29 y.o. female who presents with vaginal bleeding and lower abdominal cramping.  She is G1P0 with LNMP Oct 31rst.  Symptoms began yesterday with spotting and had episode of bright red blood today.  She has no N/V, no dysuria, no fever    History obtained from: Patient          ROS:      Positive and negative ROS elements as per HPI.  All other systems reviewed and negative.      Physical Exam:      Pulse 80  BP 117/77  Resp 18  SpO2 100 %  Temp 98.6 F (37 C)    Physical Exam  Vitals and nursing note reviewed.   Constitutional:       Appearance: Normal appearance. She is well-developed.   HENT:      Head: Normocephalic and atraumatic.      Nose: Nose normal.      Mouth/Throat:      Mouth: Mucous membranes are moist.   Eyes:      Extraocular Movements: Extraocular movements intact.      Pupils: Pupils are equal, round, and reactive to light.   Pulmonary:      Effort: Pulmonary effort is normal. No respiratory distress.   Abdominal:      General: There is no distension.      Palpations: Abdomen is soft.      Tenderness: There is no abdominal tenderness. There is no guarding.   Neurological:      General: No focal deficit present.      Mental Status: She is alert and  oriented to person, place, and time.                   PAST HISTORY        Primary Care Provider: Terressa Koyanagi, MD        PMH/PSH:    .     Past Medical History:   Diagnosis Date   . 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      Social/Family History:      She reports that she has never smoked. She has never used smokeless  tobacco. She reports current alcohol use of about 1.0 - 2.0 standard drink of alcohol per week. She reports that she does not use drugs.    Family History   Problem Relation Age of Onset   . No known problems Mother    . Lung cancer Father    . No known problems Sister    . No known problems Brother    . No known problems Sister    . No known problems Sister    . No known problems Sister    . Breast cancer Paternal Aunt 76   . Colon cancer Paternal Aunt    . Diabetes Maternal Grandfather    . Hypertension Neg Hx    . Ovarian cancer Neg Hx          Listed Medications on Arrival:    .     Home Medications     Med List Status: Complete Set By: Clois Dupes, RN at 08/25/2020  8:24 AM                Prenatal Vit-DSS-Fe Cbn-FA (PRENATAL AD PO)     Take by mouth         Allergies: She has No Known Allergies.            VISIT INFORMATION        Clinical Course in the ED:             Medications Given in the ED:    .     ED Medication Orders (From admission, onward)    None            Procedures:      Procedures      Interpretations:      I reviewed the past medical history, past surgical history, and social history.    Differential Dx (not completely inclusive): threatened miscarriage, ectopic pregnancy, UTI, subchorionic hemorrhage                  RESULTS        Lab Results:      Results     ** No results found for the last 24 hours. **              Radiology Results:      No orders to display               Scribe Attestation:      No scribe involved in the care of  this patient

## 2020-08-25 NOTE — ED Notes (Signed)
Medical screening exam done by Dr. Lucina Mellow. Patient agrees with plan to go to [facility] ED for ultrasound. [facility] called and notified of patient.

## 2020-08-25 NOTE — ED Triage Notes (Signed)
7w 0d pregnant, vag bleeding and cramping onset spotting yesterday "period like" this am

## 2020-08-25 NOTE — ED Notes (Signed)
DSW or Walking Ability Test    DSW Is the patient able to perform the following function independently without weakness or dizziness?   D (dangle): Ask patient to sit on the edge of the bed Yes   S (stand): Ask patient to stand up and observe for any fatigue or swaying Yes   W (walk): Ask patient to lift their feet completely off the floor 3-4 times Yes

## 2020-08-26 ENCOUNTER — Encounter (INDEPENDENT_AMBULATORY_CARE_PROVIDER_SITE_OTHER): Payer: Self-pay | Admitting: Obstetrics & Gynecology

## 2020-08-27 ENCOUNTER — Other Ambulatory Visit: Payer: Self-pay | Admitting: Family Medicine

## 2020-08-27 ENCOUNTER — Telehealth (INDEPENDENT_AMBULATORY_CARE_PROVIDER_SITE_OTHER): Payer: Self-pay | Admitting: Family Medicine

## 2020-08-27 ENCOUNTER — Other Ambulatory Visit (INDEPENDENT_AMBULATORY_CARE_PROVIDER_SITE_OTHER): Payer: Self-pay | Admitting: Family Medicine

## 2020-08-27 DIAGNOSIS — N63 Unspecified lump in unspecified breast: Secondary | ICD-10-CM

## 2020-08-27 NOTE — Telephone Encounter (Signed)
[facility] requesting order for US Guided Biopsy Right Breast. Based on results from US done today.    Fax: [phone]

## 2020-08-29 ENCOUNTER — Encounter (INDEPENDENT_AMBULATORY_CARE_PROVIDER_SITE_OTHER): Payer: Self-pay | Admitting: Family Medicine

## 2020-08-29 ENCOUNTER — Telehealth (INDEPENDENT_AMBULATORY_CARE_PROVIDER_SITE_OTHER): Payer: Self-pay | Admitting: Family Medicine

## 2020-09-11 ENCOUNTER — Ambulatory Visit (INDEPENDENT_AMBULATORY_CARE_PROVIDER_SITE_OTHER): Payer: No Typology Code available for payment source | Admitting: Obstetrics & Gynecology

## 2020-09-11 ENCOUNTER — Other Ambulatory Visit: Payer: Self-pay | Admitting: Family Medicine

## 2020-09-11 DIAGNOSIS — N912 Amenorrhea, unspecified: Secondary | ICD-10-CM

## 2020-09-11 LAB — TSH: TSH: 0.84 u[IU]/mL (ref 0.35–4.94)

## 2020-09-11 LAB — HCG QUANTITATIVE: hCG, Quant.: <2.4 (ref 0.0–4.9)

## 2020-09-11 NOTE — Progress Notes (Signed)
Unable to chart vital signs.  Vital signs as follows:    B/P: 135/83   Pulse: 92  Weight: 125.4lb    LMP: 07/07/20

## 2020-09-11 NOTE — Progress Notes (Signed)
HPI:     [name] is a 30 y.o. female, G1P0, Patient's last menstrual period was 07/07/2020. Here today for positive pregnancy test at home  She was seen in ED for vaginal bleeding on 12/19.   quant beta was 337 and pelvic ultrasound showed no intrauterine pregnancy .    vaginal bleeding and cramping stopped at this time.           GYN hx:  No h/o abn pap smears    OB hx: G1P0,     PMH:     Past Medical History:   Diagnosis Date   . 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 BIOPSY, BREAST, TUMOR EXCISION, ULTRASOUND NEEDLE LOCALIZATION Left 02/24/2017    Procedure: BIOPSY, BREAST, TUMOR EXCISION, ULTRASOUND NEEDLE LOCALIZATION;  Surgeon: Tilda Burrow, MD;  Location: [facility];  Service: General;  Laterality: Left;  LEFT BREAST NLOC EXCISIONAL BIOPSY WITH INTRA-OP WIRE LOCALIZATION         Family History   Problem Relation Age of Onset   . No known problems Mother    . Lung cancer Father    . No known problems Sister    . No known problems Brother    . No known problems Sister    . No known problems Sister    . No known problems Sister    . Breast cancer Paternal Aunt 79   . Colon cancer Paternal Aunt    . Diabetes Maternal Grandfather    . Hypertension Neg Hx    . Ovarian cancer Neg Hx          SH:  Social History     Socioeconomic History   . Marital status: Married   Occupational History   . Occupation: auditor   Tobacco Use   . Smoking status: Never Smoker   . Smokeless tobacco: Never Used   Vaping Use   . Vaping Use: Never used   Substance and Sexual Activity   . Alcohol use: Yes     Alcohol/week: 1.0 - 2.0 standard drink     Types: 1 - 2 Glasses of wine per week     Comment: 1-2 drinks per month (wine)weekly   . Drug use: Never   . Sexual activity: Yes     Partners: Male     Comment: pregnant     Social Determinants of Health     Financial Resource Strain: Low Risk    . Difficulty of Paying Living Expenses: Not hard at  all   Food Insecurity: No Food Insecurity   . Worried About Programme researcher, broadcasting/film/video in the Last Year: Never true   . Ran Out of Food in the Last Year: Never true   Transportation Needs: No Transportation Needs   . Lack of Transportation (Medical): No   . Lack of Transportation (Non-Medical): No   Physical Activity: Insufficiently Active   . Days of Exercise per Week: 3 days   . Minutes of Exercise per Session: 30 min   Stress: Stress Concern Present   . Feeling of Stress : To some extent   Social Connections: Moderately Integrated   . Frequency of Communication with Friends and Family: More than three times a week   . Frequency of Social Gatherings with Friends and Family: Once a week   . Attends Religious Services: More than 4 times  per year   . Active Member of Clubs or Organizations: No   . Attends Banker Meetings: Patient refused   . Marital Status: Married   Catering manager Violence: Not At Risk   . Fear of Current or Ex-Partner: No   . Emotionally Abused: No   . Physically Abused: No   . Sexually Abused: No         No Known Allergies        Current Outpatient Medications:   .  Prenatal Vit-DSS-Fe Cbn-FA (PRENATAL AD PO), Take by mouth, Disp: , Rfl:         Review of Systems -  General ROS: negative for fatigue, fever/chills, weight loss  Breast ROS: negative for breast lumps, nipple discharge  Gastrointestinal ROS: no abdominal pain, change in bowel habits  Genitourinary ROS: no dysuria, trouble voiding, or hematuria  Skin: denies rash or lesion  Psych: denies depression  All other systems reviewed- negative      O:    LMP 07/07/2020     General appearance - alert, well appearing, and in no distress  Abdomen - soft, nontender, nondistended, no masses  Extremities: no signs of clubbing or edema      sono : no gestational sac .         A:  1. Amenorrhea             Plan     Ultrasound findings and hx consistent with miscarriage   quant beta today   Discussed repeat ultrasound if quant beta  increases  Precautions discussed  - All questions answered AVS reviewed with the patient.    No orders of the defined types were placed in this encounter.      Horton Chin, MD MD

## 2020-09-12 ENCOUNTER — Other Ambulatory Visit (INDEPENDENT_AMBULATORY_CARE_PROVIDER_SITE_OTHER): Payer: Self-pay

## 2020-09-12 DIAGNOSIS — N63 Unspecified lump in unspecified breast: Secondary | ICD-10-CM

## 2020-09-14 ENCOUNTER — Encounter (INDEPENDENT_AMBULATORY_CARE_PROVIDER_SITE_OTHER): Payer: Self-pay

## 2020-09-17 ENCOUNTER — Other Ambulatory Visit (INDEPENDENT_AMBULATORY_CARE_PROVIDER_SITE_OTHER): Payer: Self-pay | Admitting: Family Medicine

## 2020-09-17 ENCOUNTER — Encounter (INDEPENDENT_AMBULATORY_CARE_PROVIDER_SITE_OTHER): Payer: Self-pay | Admitting: Family Medicine

## 2020-09-17 DIAGNOSIS — D241 Benign neoplasm of right breast: Secondary | ICD-10-CM

## 2020-09-30 ENCOUNTER — Encounter (INDEPENDENT_AMBULATORY_CARE_PROVIDER_SITE_OTHER): Payer: Self-pay | Admitting: Obstetrics & Gynecology

## 2020-10-15 ENCOUNTER — Encounter (INDEPENDENT_AMBULATORY_CARE_PROVIDER_SITE_OTHER): Payer: Self-pay

## 2020-12-13 ENCOUNTER — Encounter (INDEPENDENT_AMBULATORY_CARE_PROVIDER_SITE_OTHER): Payer: Self-pay

## 2021-01-12 ENCOUNTER — Encounter (INDEPENDENT_AMBULATORY_CARE_PROVIDER_SITE_OTHER): Payer: Self-pay

## 2021-01-30 ENCOUNTER — Ambulatory Visit (INDEPENDENT_AMBULATORY_CARE_PROVIDER_SITE_OTHER): Payer: No Typology Code available for payment source | Admitting: Family Medicine

## 2021-01-30 ENCOUNTER — Encounter (INDEPENDENT_AMBULATORY_CARE_PROVIDER_SITE_OTHER): Payer: Self-pay | Admitting: Family Medicine

## 2021-01-30 VITALS — BP 110/76 | HR 73 | Temp 97.9°F | Resp 17 | Ht 66.5 in | Wt 124.0 lb

## 2021-01-30 DIAGNOSIS — N6320 Unspecified lump in the left breast, unspecified quadrant: Secondary | ICD-10-CM

## 2021-01-30 DIAGNOSIS — D241 Benign neoplasm of right breast: Secondary | ICD-10-CM

## 2021-01-30 NOTE — Progress Notes (Signed)
Have you seen any specialists/other providers since your last visit with [place]?    Yes  [facility]    Arm preference verified?   Yes    The patient is due for none

## 2021-01-30 NOTE — Progress Notes (Signed)
[facility]                       Date of Exam: 01/30/2021 10:48 AM        Patient ID: [name] is a 30 y.o. female.  Attending Physician: Winfield Rast, MD        Chief Complaint:    Chief Complaint   Patient presents with   . Follow-up     With order ultrasound on left breast               HPI:    Patient is a 30 year old female that presents due to needing ultrasound follow-up order.  Patient does have a right breast fibroadenoma diagnosed after biopsy.  It was recommended that a 64-month ultrasound be ordered to follow-up on this.  Patient also states that he would like an ultrasound of the left breast as well.  She states that she has noticed a lump and would like to get this checked out in case.  Patient denies any pain or drainage from breasts.           Problem List:    There is no problem list on file for this patient.            Current Meds:    Outpatient Medications Marked as Taking for the 01/30/21 encounter (Office Visit) with Winfield Rast, MD   Medication Sig Dispense Refill   . Prenatal Vit-DSS-Fe Cbn-FA (PRENATAL AD PO) Take by mouth            Allergies:    No Known Allergies          Past Surgical History:    Past Surgical History:   Procedure Laterality Date   . BIOPSY, BREAST, TUMOR EXCISION, ULTRASOUND NEEDLE LOCALIZATION Left 02/24/2017    Procedure: BIOPSY, BREAST, TUMOR EXCISION, ULTRASOUND NEEDLE LOCALIZATION;  Surgeon: Tilda Burrow, MD;  Location: [facility];  Service: General;  Laterality: Left;  LEFT BREAST NLOC EXCISIONAL BIOPSY WITH INTRA-OP WIRE LOCALIZATION           Family History:    Family History   Problem Relation Age of Onset   . No known problems Mother    . Lung cancer Father    . No known problems Sister    . No known problems Brother    . No known problems Sister    . No known problems Sister    . No known problems Sister    . Breast cancer Paternal Aunt 36   . Colon cancer Paternal Aunt    . Diabetes Maternal Grandfather     . Hypertension Neg Hx    . Ovarian cancer Neg Hx            Social History:    Social History     Tobacco Use   . Smoking status: Never Smoker   . Smokeless tobacco: Never Used   Vaping Use   . Vaping Use: Never used   Substance Use Topics   . Alcohol use: Yes     Alcohol/week: 1.0 - 2.0 standard drink     Types: 1 - 2 Glasses of wine per week     Comment: 1-2 drinks per month (wine)weekly   . Drug use: Never           The following sections were reviewed this encounter by the provider:   Tobacco  Allergies  Meds  Problems  Med Hx  Surg Hx  Fam Hx               Vital Signs:    BP 110/76   Pulse 73   Temp 97.9 F (36.6 C) (Tympanic)   Resp 17   Ht 1.689 m (5' 6.5")   Wt 56.2 kg (124 lb)   LMP 07/07/2020   SpO2 100%   BMI 19.71 kg/m          ROS:    Review of Systems   Constitutional: Negative for fatigue and fever.   Cardiovascular: Negative for chest pain and palpitations.                Physical Exam:    Physical Exam  Chest:      Comments: Fibrocystic changes of both breast, moveable cyst/mass noted under left nipple nipple  Neurological:      Mental Status: She is alert.                  Assessment/Plan:    1. Fibroadenoma of right breast  - US Breast Right Complete; Future    2. Mass of left breast, unspecified quadrant  - US Breast Left Complete; Future      [place] ordered, scheduled to get these done next week  Will follow up on results         Follow-up:    No follow-ups on file.         Winfield Rast, MD

## 2021-02-06 ENCOUNTER — Other Ambulatory Visit (INDEPENDENT_AMBULATORY_CARE_PROVIDER_SITE_OTHER): Payer: Self-pay | Admitting: Family Medicine

## 2021-02-06 ENCOUNTER — Encounter (INDEPENDENT_AMBULATORY_CARE_PROVIDER_SITE_OTHER): Payer: Self-pay | Admitting: Family Medicine

## 2021-02-06 ENCOUNTER — Telehealth (INDEPENDENT_AMBULATORY_CARE_PROVIDER_SITE_OTHER): Payer: Self-pay | Admitting: Family Medicine

## 2021-02-06 DIAGNOSIS — N6320 Unspecified lump in the left breast, unspecified quadrant: Secondary | ICD-10-CM

## 2021-02-06 DIAGNOSIS — D241 Benign neoplasm of right breast: Secondary | ICD-10-CM

## 2021-02-06 NOTE — Telephone Encounter (Signed)
Sonia with [facility] is requesting an order for "US Guided Biopsy, left breast mass" Order needs to be faxed to [phone]

## 2021-02-06 NOTE — Telephone Encounter (Signed)
Patient called returning PCP's VM. Informed patient PC office is in meeting, and to call back after 1:15pm. Please call patient back when able. Thank you!

## 2021-02-06 NOTE — Telephone Encounter (Signed)
Patient is returning call from PCP. Please return call at noted phone number below.      Patient Preferred Callback Number: [phone]

## 2021-02-06 NOTE — Telephone Encounter (Signed)
Called pt and left vm to return call.

## 2021-02-12 ENCOUNTER — Encounter (INDEPENDENT_AMBULATORY_CARE_PROVIDER_SITE_OTHER): Payer: Self-pay

## 2021-03-03 ENCOUNTER — Other Ambulatory Visit (INDEPENDENT_AMBULATORY_CARE_PROVIDER_SITE_OTHER): Payer: Self-pay | Admitting: Family Medicine

## 2021-03-12 ENCOUNTER — Other Ambulatory Visit (INDEPENDENT_AMBULATORY_CARE_PROVIDER_SITE_OTHER): Payer: Self-pay | Admitting: Family Medicine

## 2021-03-12 DIAGNOSIS — D242 Benign neoplasm of left breast: Secondary | ICD-10-CM

## 2021-03-14 ENCOUNTER — Encounter (INDEPENDENT_AMBULATORY_CARE_PROVIDER_SITE_OTHER): Payer: Self-pay

## 2021-05-09 ENCOUNTER — Other Ambulatory Visit (INDEPENDENT_AMBULATORY_CARE_PROVIDER_SITE_OTHER): Payer: Self-pay | Admitting: Family Medicine

## 2021-05-30 ENCOUNTER — Encounter (HOSPITAL_BASED_OUTPATIENT_CLINIC_OR_DEPARTMENT_OTHER): Payer: Self-pay

## 2021-06-18 ENCOUNTER — Ambulatory Visit (INDEPENDENT_AMBULATORY_CARE_PROVIDER_SITE_OTHER): Payer: No Typology Code available for payment source | Admitting: Obstetrics & Gynecology

## 2021-06-18 ENCOUNTER — Encounter (INDEPENDENT_AMBULATORY_CARE_PROVIDER_SITE_OTHER): Payer: No Typology Code available for payment source | Admitting: Obstetrics & Gynecology

## 2021-06-18 ENCOUNTER — Encounter (INDEPENDENT_AMBULATORY_CARE_PROVIDER_SITE_OTHER): Payer: Self-pay | Admitting: Obstetrics & Gynecology

## 2021-06-18 VITALS — BP 133/84 | HR 112 | Wt 119.6 lb

## 2021-06-18 DIAGNOSIS — N912 Amenorrhea, unspecified: Secondary | ICD-10-CM

## 2021-06-18 LAB — PLATELET COUNT: Platelets: 222 10*3/uL (ref 142–346)

## 2021-06-18 LAB — T3: T3: 1.2 ng/mL (ref 0.48–1.59)

## 2021-06-18 LAB — HEMOGLOBIN AND HEMATOCRIT, BLOOD
Hematocrit: 38.9 % (ref 34.7–43.7)
Hgb: 13 g/dL (ref 11.4–14.8)

## 2021-06-18 LAB — VARICELLA ZOSTER ANTIBODY, IGG: Varicella, IgG: 2410 Index

## 2021-06-18 LAB — T4: T4: 10.6 ug/dL (ref 4.9–11.7)

## 2021-06-18 LAB — HEPATITIS B SURFACE ANTIGEN W/ REFLEX TO CONFIRMATION: Hepatitis B Surface Antigen: NONREACTIVE

## 2021-06-18 LAB — TSH: TSH: 0.85 u[IU]/mL (ref 0.35–4.94)

## 2021-06-18 LAB — RUBELLA ANTIBODY, IGG: Rubella AB, IgG: 6.46 Index

## 2021-06-18 LAB — HIV-1/2 AG/AB 4TH GEN. W/ REFLEX: HIV Ag/Ab, 4th Generation: NONREACTIVE

## 2021-06-18 LAB — SYPHILIS SCREEN IGG AND IGM: Syphilis Screen IgG and IgM: NONREACTIVE

## 2021-06-18 LAB — HEPATITIS C ANTIBODY: Hepatitis C, AB: NONREACTIVE

## 2021-06-18 LAB — ANTIBODY SCREEN: AB Screen Gel: NEGATIVE

## 2021-06-18 LAB — ABO/RH: ABO Rh: O POS

## 2021-06-18 NOTE — Progress Notes (Signed)
HPI:     [name] is a 30 y.o. female, G2P0, Patient's last menstrual period was 04/26/2021. 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         PSH:     Past Surgical History:   Procedure Laterality Date    BIOPSY, BREAST, TUMOR EXCISION, ULTRASOUND NEEDLE LOCALIZATION Left 02/24/2017    Procedure: BIOPSY, BREAST, TUMOR EXCISION, ULTRASOUND NEEDLE LOCALIZATION;  Surgeon: Tilda Burrow, MD;  Location:  [facility];  Service: General;  Laterality: Left;  LEFT BREAST NLOC EXCISIONAL BIOPSY WITH INTRA-OP WIRE LOCALIZATION         Family History   Problem Relation Age of Onset    No known problems Mother     Lung cancer Father     No known problems Sister     No known problems Brother     No known problems Sister     No known problems Sister     No known problems Sister     Breast cancer Paternal Aunt 65    Colon cancer Paternal Aunt     Diabetes Maternal Grandfather     Hypertension Neg Hx     Ovarian cancer Neg Hx          SH:  Social History     Socioeconomic History    Marital status: Married   Occupational History    Occupation: Product/process development scientist   Tobacco Use    Smoking status: Never    Smokeless tobacco: Never   Vaping Use    Vaping Use: Never used   Substance and Sexual Activity    Alcohol use: Yes     Alcohol/week: 1.0 - 2.0 standard drink     Types: 1 - 2 Glasses of wine per week     Comment: 1-2 drinks per month (wine)weekly    Drug use: Never    Sexual activity: Yes     Partners: Male     Comment: pregnant     Social Determinants of Health     Financial Resource Strain: Low Risk     Difficulty of Paying Living Expenses: Not hard at all   Food Insecurity: No Food Insecurity    Worried About Programme researcher, broadcasting/film/video in the Last Year: Never true    Barista in the Last Year: Never true   Transportation  Needs: No Transportation Needs    Lack of Transportation (Medical): No    Lack of Transportation (Non-Medical): No   Physical Activity: Insufficiently Active    Days of Exercise per Week: 3 days    Minutes of Exercise per Session: 30 min   Stress: Stress Concern Present    Feeling of Stress : To some extent   Social Connections: Moderately Integrated    Frequency of Communication with Friends and Family: More than three times a week    Frequency of Social Gatherings with Friends and Family: Once a week    Attends Religious Services: More than 4 times per year    Active Member of [employer] or Organizations: No    Attends Banker Meetings: Patient refused    Marital Status: Married  Intimate Partner Violence: Not At Risk    Fear of Current or Ex-Partner: No    Emotionally Abused: No    Physically Abused: No    Sexually Abused: No         No Known Allergies        Current Outpatient Medications:     Prenatal Vit-DSS-Fe Cbn-FA (PRENATAL AD PO), Take by mouth, Disp: , Rfl:         Review of Systems -  General ROS: negative for fatigue, fever/chills, weight loss  Breast ROS: negative for breast lumps, nipple discharge  Gastrointestinal ROS: no abdominal pain, change in bowel habits  Genitourinary ROS: no dysuria, trouble voiding, or hematuria  Skin: denies rash or lesion  Psych: denies depression  All other systems reviewed- negative      O:    BP 133/84    Pulse (!) 112    Wt 119 lb 9.6 oz (54.3 kg)    LMP 04/26/2021    BMI 19.01 kg/m     General appearance - alert, well appearing, and in no distress  Abdomen - soft, nontender, nondistended, no masses  Extremities: no signs of clubbing or edema     Sono : single intrauterine pregnancy   CRL measuring 6w6 EDC 02/05/2022   Fetal heart tone noted          A:  1. Amenorrhea             Plan  --Prenatal care discussed in detail  -Reviewed [place] packet  -Discussed aneuploidy screening with cell free DNA, nuchal translucency  - Prenatal labs today  -discussed diet  and nutrition in pregnancy  -req given for nuchal transl at MFM  -  Follow up in 4 weeks  - All questions answered AVS reviewed with the patient.    No orders of the defined types were placed in this encounter.      Horton Chin, MD MD

## 2021-06-19 ENCOUNTER — Encounter (INDEPENDENT_AMBULATORY_CARE_PROVIDER_SITE_OTHER): Payer: Self-pay | Admitting: Obstetrics & Gynecology

## 2021-06-22 LAB — RUBELLA ANTIBODY, IGG: Rubella AB, IgG: IMMUNE

## 2021-06-22 LAB — SYPHILIS SCREEN IGG AND IGM: Syphilis Screen IgG and IgM: NONREACTIVE

## 2021-06-22 LAB — HEPATITIS B SURFACE ANTIGEN W/ REFLEX TO CONFIRMATION: Hepatitis B Surface Antigen: NEGATIVE

## 2021-06-22 LAB — HIV AG/AB 4TH GENERATION: HIV Ag/Ab, 4th Generation: NEGATIVE

## 2021-07-14 NOTE — Progress Notes (Signed)
She is doing well   Fetal heart tone noted by ultrasound  Sono : single intrauterine pregnancy CRL 11w6 EDC 5/27/202    Pregnancy complicated by   - aneuploidy screening : req given for nuchal trans.  - reviewed prenatal labs  - Preterm labor precautions discussed

## 2021-07-15 ENCOUNTER — Other Ambulatory Visit (INDEPENDENT_AMBULATORY_CARE_PROVIDER_SITE_OTHER): Payer: Self-pay | Admitting: Obstetrics & Gynecology

## 2021-07-17 ENCOUNTER — Encounter: Payer: Self-pay | Admitting: Obstetrics & Gynecology

## 2021-07-17 DIAGNOSIS — N912 Amenorrhea, unspecified: Secondary | ICD-10-CM

## 2021-07-18 ENCOUNTER — Ambulatory Visit (INDEPENDENT_AMBULATORY_CARE_PROVIDER_SITE_OTHER): Payer: No Typology Code available for payment source | Admitting: Obstetrics & Gynecology

## 2021-07-18 ENCOUNTER — Encounter (INDEPENDENT_AMBULATORY_CARE_PROVIDER_SITE_OTHER): Payer: Self-pay | Admitting: Obstetrics & Gynecology

## 2021-07-18 ENCOUNTER — Other Ambulatory Visit (INDEPENDENT_AMBULATORY_CARE_PROVIDER_SITE_OTHER): Payer: Self-pay | Admitting: Obstetrics & Gynecology

## 2021-07-18 VITALS — BP 113/77 | HR 92 | Wt 123.6 lb

## 2021-07-18 DIAGNOSIS — Z3A11 11 weeks gestation of pregnancy: Secondary | ICD-10-CM

## 2021-07-18 MED ORDER — ONDANSETRON HCL 4 MG PO TABS
4.0000 mg | ORAL_TABLET | Freq: Two times a day (BID) | ORAL | 0 refills | Status: DC | PRN
Start: 2021-07-18 — End: 2022-02-12

## 2021-07-18 NOTE — Progress Notes (Signed)
Unable to chart vitals.     W: 123.6 lbs   BP: 113/77  P: 92  LMP: 04/26/2021

## 2021-07-21 ENCOUNTER — Encounter (INDEPENDENT_AMBULATORY_CARE_PROVIDER_SITE_OTHER): Payer: Self-pay | Admitting: Obstetrics & Gynecology

## 2021-07-24 ENCOUNTER — Inpatient Hospital Stay
Admission: RE | Admit: 2021-07-24 | Discharge: 2021-07-24 | Disposition: A | Discharge status: Home / Self Care | Payer: No Typology Code available for payment source | Source: Ambulatory Visit | Attending: Obstetrics & Gynecology | Admitting: Obstetrics & Gynecology

## 2021-07-24 DIAGNOSIS — Z3492 Encounter for supervision of normal pregnancy, unspecified, second trimester: Secondary | ICD-10-CM

## 2021-07-24 DIAGNOSIS — N912 Amenorrhea, unspecified: Secondary | ICD-10-CM

## 2021-07-24 DIAGNOSIS — N8312 Corpus luteum cyst of left ovary: Secondary | ICD-10-CM | POA: Insufficient documentation

## 2021-07-24 DIAGNOSIS — Z3491 Encounter for supervision of normal pregnancy, unspecified, first trimester: Secondary | ICD-10-CM

## 2021-07-24 DIAGNOSIS — O468X1 Other antepartum hemorrhage, first trimester: Secondary | ICD-10-CM | POA: Insufficient documentation

## 2021-07-24 DIAGNOSIS — O3481 Maternal care for other abnormalities of pelvic organs, first trimester: Secondary | ICD-10-CM | POA: Insufficient documentation

## 2021-07-24 DIAGNOSIS — Z3682 Encounter for antenatal screening for nuchal translucency: Secondary | ICD-10-CM | POA: Insufficient documentation

## 2021-07-24 DIAGNOSIS — Z3A12 12 weeks gestation of pregnancy: Secondary | ICD-10-CM | POA: Insufficient documentation

## 2021-08-01 ENCOUNTER — Encounter (INDEPENDENT_AMBULATORY_CARE_PROVIDER_SITE_OTHER): Payer: Self-pay | Admitting: Family Medicine

## 2021-08-04 ENCOUNTER — Encounter (HOSPITAL_BASED_OUTPATIENT_CLINIC_OR_DEPARTMENT_OTHER): Payer: Self-pay

## 2021-08-04 ENCOUNTER — Other Ambulatory Visit (INDEPENDENT_AMBULATORY_CARE_PROVIDER_SITE_OTHER): Payer: Self-pay | Admitting: Family Medicine

## 2021-08-04 ENCOUNTER — Telehealth (INDEPENDENT_AMBULATORY_CARE_PROVIDER_SITE_OTHER): Payer: Self-pay

## 2021-08-04 DIAGNOSIS — D242 Benign neoplasm of left breast: Secondary | ICD-10-CM

## 2021-08-04 NOTE — Telephone Encounter (Signed)
Faxed Breast US order to [facility]. Confirmation received.

## 2021-08-15 ENCOUNTER — Ambulatory Visit (INDEPENDENT_AMBULATORY_CARE_PROVIDER_SITE_OTHER): Payer: No Typology Code available for payment source | Admitting: Obstetrics & Gynecology

## 2021-08-15 VITALS — BP 128/82 | HR 118 | Wt 128.8 lb

## 2021-08-15 DIAGNOSIS — Z3A16 16 weeks gestation of pregnancy: Secondary | ICD-10-CM

## 2021-08-15 DIAGNOSIS — Z3492 Encounter for supervision of normal pregnancy, unspecified, second trimester: Secondary | ICD-10-CM

## 2021-08-15 NOTE — Progress Notes (Signed)
She is doing well     Fetal heart tone noted by ultrasound  Sono : single intrauterine pregnancy CRL 11w6 EDC 5/27/202  No adnexal masses    Pregnancy complicated by   - aneuploidy screening : nuchal trans.negative   Left ovarian simple cyst  - reviewed prenatal labs  - Preterm labor precautions discussed

## 2021-08-20 ENCOUNTER — Ambulatory Visit (INDEPENDENT_AMBULATORY_CARE_PROVIDER_SITE_OTHER): Payer: No Typology Code available for payment source | Admitting: Obstetrics & Gynecology

## 2021-08-22 ENCOUNTER — Other Ambulatory Visit (FREE_STANDING_LABORATORY_FACILITY): Payer: No Typology Code available for payment source

## 2021-08-22 DIAGNOSIS — Z3A16 16 weeks gestation of pregnancy: Secondary | ICD-10-CM

## 2021-08-22 DIAGNOSIS — Z369 Encounter for antenatal screening, unspecified: Secondary | ICD-10-CM

## 2021-08-25 LAB — ALPHA FETOPROTEIN, MATERNAL
Gestational Age at Collection by Ultrasound: 170 wk,d
Maternal Alpha-Fetoprotein MoM: 0.96 {M.o.M} (ref <2.50)
Maternal Alpha-Fetoprotein: 45.2 ng/mL
Maternal Calculated Age at EDD: 31
Maternal Serum Screen Result Summary: NORMAL
Number of Fetuses: 1
Weight in Pounds: 129 [lb_av]

## 2021-09-04 ENCOUNTER — Other Ambulatory Visit: Payer: Self-pay | Admitting: Family Medicine

## 2021-09-05 ENCOUNTER — Encounter (INDEPENDENT_AMBULATORY_CARE_PROVIDER_SITE_OTHER): Payer: Self-pay | Admitting: Family Medicine

## 2021-09-18 ENCOUNTER — Encounter (HOSPITAL_BASED_OUTPATIENT_CLINIC_OR_DEPARTMENT_OTHER): Payer: No Typology Code available for payment source

## 2021-09-24 ENCOUNTER — Encounter (INDEPENDENT_AMBULATORY_CARE_PROVIDER_SITE_OTHER): Payer: Self-pay

## 2021-09-24 ENCOUNTER — Ambulatory Visit (INDEPENDENT_AMBULATORY_CARE_PROVIDER_SITE_OTHER): Payer: No Typology Code available for payment source | Admitting: Family

## 2021-09-24 VITALS — BP 118/70 | HR 94 | Temp 97.9°F | Resp 16 | Ht 66.0 in | Wt 134.0 lb

## 2021-09-24 DIAGNOSIS — Z87440 Personal history of urinary (tract) infections: Secondary | ICD-10-CM

## 2021-09-24 LAB — VH POCT UA-AUTOMATED(UCC)
Bilirubin, UA POCT: NEGATIVE
Blood, UA POCT: NEGATIVE
Glucose, UA POCT: NEGATIVE
Ketones, UA POCT: NEGATIVE mg/dL
Nitrite, UA POCT: NEGATIVE
PH, UA POCT: 7.5 (ref 4.6–8)
Protein, UA POCT: NEGATIVE mg/dL
Specific Gravity, UA POCT: 1.02 mg/dL (ref 1.001–1.035)
Urine Leukocytes POCT: NEGATIVE
Urobilinogen, UA POCT: 0.2 mg/dL

## 2021-09-24 NOTE — Progress Notes (Signed)
Urine culture collected from patient by [facility]. This staff member prepared specimen, applied required charges and placed in courier box for lab courier to pick up.    Paris Lore  13:52

## 2021-09-24 NOTE — Progress Notes (Signed)
Subjective:    Patient ID:   [name] is a 31 y.o. female patient who presents today to be evaluated for urinary tract infection.  She states that she is currently over [redacted] weeks pregnant.  She has had some dysuria and a few drops of blood in her urine last week.  She states that she was evaluated by her midwife on Wednesday.  They tested her for urinary tract infection.  The results came back yesterday as positive.  She indicates they told her there was resistance to the antibiotic that she could receive.  She was encouraged to follow-up with an infectious disease provider.  She states that she has not experienced any symptoms since that time and is unsure whether or not she still has a urinary tract infection.    The following portions of the patient's history were reviewed and updated as appropriate: allergies, current medications, past medical history, past surgical history and problem list.    Review of Systems   Constitutional:  Negative for chills and fever.   Gastrointestinal:  Negative for abdominal pain.   Genitourinary:  Negative for dysuria, frequency, hematuria, pelvic pain, urgency, vaginal bleeding, vaginal discharge and vaginal pain.     Objective:     Vitals:    09/24/21 1229   BP: 118/70   Pulse: 94   Resp: 16   Temp: 97.9 F (36.6 C)       Physical Exam  Constitutional:       General: She is not in acute distress.     Appearance: Normal appearance. She is not ill-appearing or toxic-appearing.   Eyes:      Conjunctiva/sclera: Conjunctivae normal.   Cardiovascular:      Rate and Rhythm: Normal rate.      Heart sounds: Normal heart sounds.   Pulmonary:      Effort: Pulmonary effort is normal. No respiratory distress.      Breath sounds: No stridor. No wheezing, rhonchi or rales.   Abdominal:      General: Bowel sounds are normal.      Palpations: Abdomen is soft.      Tenderness: There is no abdominal tenderness. There is no right CVA tenderness.   Skin:     General: Skin is warm and  dry.   Neurological:      Mental Status: She is alert.   Psychiatric:         Mood and Affect: Mood normal.         Behavior: Behavior normal.       Results       Procedure Component Value Units Date/Time    VH POCT UA AUTO [[id number]]  (Normal) Collected: 09/24/21 1239     Updated: 09/24/21 1247     Urine Color POCT Light Yellow     Urine Clarity POCT Slightly Cloudy     Glucose, UA POCT Negative     Bilirubin, UA POCT Negative     Ketones, UA POCT Negative mg/dL      Specific Gravity, UA POCT 1.020 mg/dL      Blood, UA POCT  Negative     PH, UA POCT 7.5     Protein, UA POCT Negative mg/dL      Urobilinogen, UA POCT 0.2 mg/dL      Nitrite, UA POCT Negative     Urine Leukocytes POCT Negative             Assessment and Plan:   Vani was seen today  for urinary tract infection symptoms.    Diagnoses and all orders for this visit:    History of UTI  -     VH POCT UA AUTO  -     Urine Culture; Future        PLAN:  -A urinalysis was ordered and reviewed.  The patient does not appear to currently have a urinary tract infection.  -The patient was advised to follow-up with her midwife.  She should follow-up with her primary care provider if she develops further symptoms of urinary tract infection.  -We discussed that a urine culture would be sent for further confirmation.    Raina Mina, NP  [facility]  09/24/2021 1:17 PM

## 2021-09-25 ENCOUNTER — Other Ambulatory Visit
Admission: RE | Admit: 2021-09-25 | Discharge: 2021-09-25 | Disposition: A | Discharge status: Home / Self Care | Payer: No Typology Code available for payment source | Source: Ambulatory Visit | Attending: Family | Admitting: Family

## 2021-09-25 DIAGNOSIS — Z87440 Personal history of urinary (tract) infections: Secondary | ICD-10-CM

## 2021-09-26 ENCOUNTER — Encounter (INDEPENDENT_AMBULATORY_CARE_PROVIDER_SITE_OTHER): Payer: Self-pay | Admitting: Family

## 2021-09-26 LAB — VH CULTURE, URINE: Culture Result: POSITIVE

## 2022-01-05 LAB — GROUP B STREP TRANSCRIBED: GBS Transcribed: POSITIVE

## 2022-02-12 ENCOUNTER — Encounter: Payer: Self-pay | Admitting: Obstetrics & Gynecology

## 2022-02-12 ENCOUNTER — Inpatient Hospital Stay
Admission: RE | Admit: 2022-02-12 | Discharge: 2022-02-15 | DRG: 788 | Disposition: A | Discharge status: Home / Self Care | Payer: No Typology Code available for payment source | Attending: Student in an Organized Health Care Education/Training Program | Admitting: Student in an Organized Health Care Education/Training Program

## 2022-02-12 DIAGNOSIS — Z3A41 41 weeks gestation of pregnancy: Secondary | ICD-10-CM

## 2022-02-12 DIAGNOSIS — Z98891 History of uterine scar from previous surgery: Secondary | ICD-10-CM

## 2022-02-12 DIAGNOSIS — O99824 Streptococcus B carrier state complicating childbirth: Secondary | ICD-10-CM | POA: Diagnosis present

## 2022-02-12 DIAGNOSIS — O480 Post-term pregnancy: Principal | ICD-10-CM | POA: Diagnosis present

## 2022-02-12 DIAGNOSIS — D252 Subserosal leiomyoma of uterus: Secondary | ICD-10-CM | POA: Diagnosis present

## 2022-02-12 DIAGNOSIS — O3413 Maternal care for benign tumor of corpus uteri, third trimester: Secondary | ICD-10-CM | POA: Diagnosis present

## 2022-02-12 HISTORY — DX: Streptococcus, group b, as the cause of diseases classified elsewhere: B95.1

## 2022-02-12 LAB — TYPE AND SCREEN
AB Screen Gel: NEGATIVE
ABO Rh: O POS

## 2022-02-12 LAB — CBC AND DIFFERENTIAL
Absolute NRBC: 0 10*3/uL (ref 0.00–0.00)
Basophils Absolute Automated: 0.03 10*3/uL (ref 0.00–0.08)
Basophils Automated: 0.2 %
Eosinophils Absolute Automated: 0.03 10*3/uL (ref 0.00–0.44)
Eosinophils Automated: 0.2 %
Hematocrit: 41.1 % (ref 34.7–43.7)
Hgb: 14.2 g/dL (ref 11.4–14.8)
Immature Granulocytes Absolute: 0.08 10*3/uL — ABNORMAL HIGH (ref 0.00–0.07)
Immature Granulocytes: 0.5 %
Instrument Absolute Neutrophil Count: 12.34 10*3/uL — ABNORMAL HIGH (ref 1.10–6.33)
Lymphocytes Absolute Automated: 1.37 10*3/uL (ref 0.42–3.22)
Lymphocytes Automated: 8.8 %
MCH: 32.9 pg (ref 25.1–33.5)
MCHC: 34.5 g/dL (ref 31.5–35.8)
MCV: 95.1 fL (ref 78.0–96.0)
MPV: 11.3 fL (ref 8.9–12.5)
Monocytes Absolute Automated: 1.71 10*3/uL — ABNORMAL HIGH (ref 0.21–0.85)
Monocytes: 11 %
Neutrophils Absolute: 12.34 10*3/uL — ABNORMAL HIGH (ref 1.10–6.33)
Neutrophils: 79.3 %
Nucleated RBC: 0 /100 WBC (ref 0.0–0.0)
Platelets: 154 10*3/uL (ref 142–346)
RBC: 4.32 10*6/uL (ref 3.90–5.10)
RDW: 14 % (ref 11–15)
WBC: 15.56 10*3/uL — ABNORMAL HIGH (ref 3.10–9.50)

## 2022-02-12 MED ORDER — TERBUTALINE SULFATE 1 MG/ML IJ SOLN
0.2500 mg | Freq: Once | INTRAMUSCULAR | Status: DC | PRN
Start: 2022-02-12 — End: 2022-02-13

## 2022-02-12 MED ORDER — MISOPROSTOL 25 MCG PO SPLIT TAB
25.0000 ug | ORAL_TABLET | ORAL | Status: DC
Start: 2022-02-12 — End: 2022-02-12
  Administered 2022-02-12: 25 ug via BUCCAL
  Filled 2022-02-12: qty 1

## 2022-02-12 MED ORDER — NALOXONE HCL 0.4 MG/ML IJ SOLN (WRAP)
0.2000 mg | INTRAMUSCULAR | Status: DC | PRN
Start: 2022-02-12 — End: 2022-02-13

## 2022-02-12 MED ORDER — SODIUM CHLORIDE (PF) 0.9 % IJ SOLN
3.0000 mL | Freq: Three times a day (TID) | INTRAMUSCULAR | Status: DC
Start: 2022-02-12 — End: 2022-02-13

## 2022-02-12 MED ORDER — SOD CITRATE-CITRIC ACID 500-334 MG/5ML PO SOLN
30.0000 mL | Freq: Once | ORAL | Status: DC | PRN
Start: 2022-02-12 — End: 2022-02-13

## 2022-02-12 MED ORDER — ACETAMINOPHEN 650 MG RE SUPP
650.0000 mg | RECTAL | Status: DC | PRN
Start: 2022-02-12 — End: 2022-02-13

## 2022-02-12 MED ORDER — ONDANSETRON HCL 4 MG/2ML IJ SOLN
4.0000 mg | Freq: Three times a day (TID) | INTRAMUSCULAR | Status: DC | PRN
Start: 2022-02-12 — End: 2022-02-13
  Filled 2022-02-12: qty 2

## 2022-02-12 MED ORDER — LACTATED RINGERS IV BOLUS
500.0000 mL | Freq: Once | INTRAVENOUS | Status: AC
Start: 2022-02-12 — End: 2022-02-12
  Administered 2022-02-12: 500 mL via INTRAVENOUS

## 2022-02-12 MED ORDER — ACETAMINOPHEN 325 MG PO TABS
650.0000 mg | ORAL_TABLET | ORAL | Status: DC | PRN
Start: 2022-02-12 — End: 2022-02-13

## 2022-02-12 MED ORDER — ALUM & MAG HYDROXIDE-SIMETH 200-200-20 MG/5ML PO SUSP
30.0000 mL | Freq: Four times a day (QID) | ORAL | Status: DC | PRN
Start: 2022-02-12 — End: 2022-02-13

## 2022-02-12 MED ORDER — HYDROXYZINE PAMOATE 25 MG PO CAPS
50.0000 mg | ORAL_CAPSULE | Freq: Once | ORAL | Status: DC | PRN
Start: 2022-02-12 — End: 2022-02-13
  Filled 2022-02-12: qty 2

## 2022-02-12 MED ORDER — ONDANSETRON 4 MG PO TBDP
4.0000 mg | ORAL_TABLET | Freq: Three times a day (TID) | ORAL | Status: DC | PRN
Start: 2022-02-12 — End: 2022-02-13

## 2022-02-12 MED ORDER — HYDROXYZINE HCL 10 MG PO TABS
50.0000 mg | ORAL_TABLET | Freq: Once | ORAL | Status: DC | PRN
Start: 2022-02-12 — End: 2022-02-12

## 2022-02-12 MED ORDER — CALCIUM CARBONATE ANTACID 500 MG PO CHEW
1000.0000 mg | CHEWABLE_TABLET | Freq: Three times a day (TID) | ORAL | Status: DC | PRN
Start: 2022-02-12 — End: 2022-02-13

## 2022-02-12 NOTE — Nursing Progress Note (Incomplete)
Called into room by pt's partner. Cook catheter fell out while

## 2022-02-12 NOTE — Nursing Progress Note (Signed)
Spoke to [employer] CNM on phone to discuss pt's plan of care. Due to cat II EFM following first cytotec dose CNM states best course of action would be to start oxytocin infusion per low dose protocol. RN in room to discuss plan of care with pt, pt hesitant to start oxytocin. Educated pt on the benefit of oxytocin having a much shorter half-life than cytotec which would allow for an easier resolution to any fetal distress resulting from induction / augmentation of labor. Pt states she whishes to discuss plan of care with her doula and will call RN when she makes a decision.

## 2022-02-12 NOTE — Progress Notes (Signed)
Report given to Emma, RN. Pt care relinquished at this time.

## 2022-02-12 NOTE — H&P (Addendum)
Labor and Delivery Admit Note    Subjective:   [name] is a 31 y.o. G2P0 female at 41.[redacted] weeks gestation, Estimated Date of Delivery: 01/30/2022.  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      She is being admitted for induction of labor due to post dates.  Significant factors related to the current pregnancy include: GBS positive culture.  Patient reports contractions since this am, mild and every 10 minutes .  She denies vaginal bleeding and leakage of fluid.  Fetal Movement: normal.  She had an ultrasound yesterday with EFW of 9 pounds, 4 ounces.   She did castor oil for home induction yesterday with no onset of regular, painful contractions.    Review of Systems:  All other systems were reviewed and are negative except as previously noted in the HPI.    Objective:    Vital signs in last 24 hours:  Temp:  [99.1 F (37.3 C)] 99.1 F (37.3 C)  Heart Rate:  [118] 118  BP: (132)/(88) 132/88     FHTS: Cat 1, baseline 145, accels present, decels absent  Toco:  Mild contractions q 5-6 minutes  General:   alert, appears stated age, and cooperative   Lungs:   clear to auscultation bilaterally   Heart:   regular rate and rhythm   Abdomen:   gravid, non-tender   SVE:  1 per her [facility] midwife approx 4 hours ago (who did a sweep as well).  Brief sonogram - vertex and ROT.  EFW by my leopolds 8 pounds, 12 ounces     Lab Review:  Lab Results   Component Value Date    ABORH O POS 06/18/2021    RUBELLAABIGG 6.46 06/18/2021    HEPBSAG Non-Reactive 06/18/2021    RPR Nonreactive 06/18/2021        Assessment:  31 y.o. G2P0 female at 41.[redacted] weeks gestation  Not in labor.  For induction of labor  GBS positive  EFW 8 pounds, 12 ounces by leopolds  For unmedicated, low intervention birth    Problem List:  Patient Active Problem List   Diagnosis    Post-dates pregnancy       Plan:  Admit to L&D  Cytotec q 3 hours  Intermittent monitoring  Move to tub room when active  Ampicillin when active  Anticipate  normal spontaneous vaginal delivery     Risks, benefits, alternatives and possible complications have been discussed in detail with the patient.  All questions answered and appropriate consents will be completed at the hospital.      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    Group B streptococcal infection in pregnancy      PAST SURGICAL HISTORY  Past Surgical History:   Procedure Laterality Date    BIOPSY, BREAST, TUMOR EXCISION, ULTRASOUND NEEDLE LOCALIZATION Left 02/24/2017    Procedure: BIOPSY, BREAST, TUMOR EXCISION, ULTRASOUND NEEDLE LOCALIZATION;  Surgeon: Tilda Burrow, MD;  Location: [facility];  Service: General;  Laterality: Left;  LEFT BREAST NLOC EXCISIONAL BIOPSY WITH INTRA-OP WIRE LOCALIZATION    BREAST BIOPSY       OB History   Gravida Para Term Preterm AB Living   2             SAB IAB Ectopic Multiple Live Births                  #  Outcome Date GA Lbr Len/2nd Weight Sex Delivery Anes PTL Lv   2 Current            1 Gravida 2021             MEDS  No current facility-administered medications on file prior to encounter.     Current Outpatient Medications on File Prior to Encounter   Medication Sig Dispense Refill    Multiple Vitamin (VITAMIN E/FOLIC ACID/B-6/B-12 PO) Take by mouth      Prenatal Vit-DSS-Fe Cbn-FA (PRENATAL AD PO) Take by mouth      [DISCONTINUED] Doxylamine Succinate, Sleep, (UNISOM PO) Take by mouth      [DISCONTINUED] ondansetron (ZOFRAN) 4 MG tablet Take 1 tablet (4 mg) by mouth every 12 (twelve) hours as needed for Nausea 30 tablet 0     ALLERGIES  No Known Allergies  FAMILY HISTORY  Family History   Problem Relation Age of Onset    No known problems Mother     Lung cancer Father     No known problems Sister     No known problems Brother     No known problems Sister     No known problems Sister     No known problems Sister     Breast cancer Paternal Aunt 69    Colon cancer Paternal Aunt      Diabetes Maternal Grandfather     Hypertension Neg Hx     Ovarian cancer Neg Hx      SOCIAL HISTORY  Social History     Occupational History    Occupation: Product/process development scientist   Tobacco Use    Smoking status: Never    Smokeless tobacco: Never   Vaping Use    Vaping status: Never Used   Substance and Sexual Activity    Alcohol use: Not Currently     Alcohol/week: 1.0 - 2.0 standard drink of alcohol     Types: 1 - 2 Glasses of wine per week     Comment: 1-2 drinks per month (wine)weekly    Drug use: Never    Sexual activity: Yes     Partners: Male     Comment: pregnant

## 2022-02-12 NOTE — Nursing Progress Note (Signed)
Called into pt's room, pt states after discussing with doula she desires to try cervical ripening balloon before starting oxytocin infusion. Lupita Dawn CNM updated via secure chat and is en route to hospital to see pt and place cervical ripening balloon.

## 2022-02-13 ENCOUNTER — Observation Stay: Payer: No Typology Code available for payment source | Admitting: Pain Medicine

## 2022-02-13 ENCOUNTER — Encounter: Payer: Self-pay | Admitting: Obstetrics & Gynecology

## 2022-02-13 ENCOUNTER — Encounter
Admission: RE | Disposition: A | Discharge status: Home / Self Care | Payer: Self-pay | Source: Home / Self Care | Attending: Student in an Organized Health Care Education/Training Program

## 2022-02-13 DIAGNOSIS — Z98891 History of uterine scar from previous surgery: Secondary | ICD-10-CM

## 2022-02-13 LAB — URINE - CHLAMYDIA/NEISSERIA/TRICH PCR
Chlamydia DNA by PCR: NEGATIVE
Neisseria gonorrhoeae by PCR: NEGATIVE
Trichomonas vaginalis, DNA: NEGATIVE

## 2022-02-13 SURGERY — Surgical Case
Anesthesia: Regional | Site: Abdomen | Wound class: Clean Contaminated

## 2022-02-13 MED ORDER — OXYTOCIN-SODIUM CHLORIDE 30-0.9 UT/500ML-% IV SOLN
7.5000 [IU]/h | INTRAVENOUS | Status: AC | PRN
Start: 2022-02-13 — End: 2022-02-14

## 2022-02-13 MED ORDER — AZITHROMYCIN 500 MG IN 250 ML NS IVPB VIAL-MATE (CNR)
500.0000 mg | Freq: Once | INTRAVENOUS | Status: AC
Start: 2022-02-13 — End: 2022-02-13
  Administered 2022-02-13: 500 mg via INTRAVENOUS

## 2022-02-13 MED ORDER — LACTATED RINGERS IV BOLUS
1000.0000 mL | Freq: Once | INTRAVENOUS | Status: DC
Start: 2022-02-13 — End: 2022-02-13

## 2022-02-13 MED ORDER — LIDOCAINE-EPINEPHRINE 2 %-1:200000 IJ SOLN
INTRAMUSCULAR | Status: DC | PRN
Start: 2022-02-13 — End: 2022-02-13
  Administered 2022-02-13: 10 mL via EPIDURAL
  Administered 2022-02-13: 5 mL via EPIDURAL

## 2022-02-13 MED ORDER — SODIUM BICARBONATE 8.4 % IV SOLN
INTRAVENOUS | Status: AC
Start: 2022-02-13 — End: ?
  Filled 2022-02-13: qty 50

## 2022-02-13 MED ORDER — KETOROLAC TROMETHAMINE 30 MG/ML IJ SOLN
15.0000 mg | Freq: Four times a day (QID) | INTRAMUSCULAR | Status: AC
Start: 2022-02-14 — End: 2022-02-14
  Administered 2022-02-14: 15 mg via INTRAVENOUS
  Filled 2022-02-13 (×2): qty 1

## 2022-02-13 MED ORDER — KETOROLAC TROMETHAMINE 30 MG/ML IJ SOLN
INTRAMUSCULAR | Status: DC | PRN
Start: 2022-02-13 — End: 2022-02-13
  Administered 2022-02-13: 30 mg via INTRAVENOUS

## 2022-02-13 MED ORDER — KETOROLAC TROMETHAMINE 30 MG/ML IJ SOLN
30.0000 mg | Freq: Four times a day (QID) | INTRAMUSCULAR | Status: DC | PRN
Start: 2022-02-13 — End: 2022-02-15
  Administered 2022-02-14 (×2): 30 mg via INTRAVENOUS
  Filled 2022-02-13 (×2): qty 1

## 2022-02-13 MED ORDER — STERILE WATER FOR INJECTION IJ/IV SOLN (WRAP)
2.0000 g | INTRAMUSCULAR | Status: AC
Start: 2022-02-13 — End: 2022-02-13
  Administered 2022-02-13: 2 g via INTRAVENOUS

## 2022-02-13 MED ORDER — METHYLERGONOVINE MALEATE 0.2 MG/ML IJ SOLN
0.2000 mg | INTRAMUSCULAR | Status: DC | PRN
Start: 2022-02-13 — End: 2022-02-13

## 2022-02-13 MED ORDER — SODIUM CHLORIDE 0.9 % IV SOLN
6.2500 mg | Freq: Four times a day (QID) | INTRAVENOUS | Status: DC | PRN
Start: 2022-02-13 — End: 2022-02-13

## 2022-02-13 MED ORDER — ONDANSETRON HCL 4 MG/2ML IJ SOLN
4.0000 mg | Freq: Three times a day (TID) | INTRAMUSCULAR | Status: DC | PRN
Start: 2022-02-13 — End: 2022-02-15

## 2022-02-13 MED ORDER — WITCH HAZEL EX PADS (WRAP)
MEDICATED_PAD | CUTANEOUS | Status: DC | PRN
Start: 2022-02-13 — End: 2022-02-15

## 2022-02-13 MED ORDER — NALOXONE HCL 0.4 MG/ML IJ SOLN (WRAP)
0.1000 mg | INTRAMUSCULAR | Status: DC | PRN
Start: 2022-02-13 — End: 2022-02-15

## 2022-02-13 MED ORDER — TRANEXAMIC ACID-NACL 1000-0.7 MG/100ML-% IV SOLN (NARRATOR USE ONLY)
INTRAVENOUS | Status: DC | PRN
Start: 2022-02-13 — End: 2022-02-13
  Administered 2022-02-13: 1000 mg via INTRAVENOUS

## 2022-02-13 MED ORDER — FENTANYL CITRATE (PF) 50 MCG/ML IJ SOLN (WRAP)
INTRAMUSCULAR | Status: DC | PRN
Start: 2022-02-13 — End: 2022-02-13

## 2022-02-13 MED ORDER — METOCLOPRAMIDE HCL 5 MG/ML IJ SOLN
10.0000 mg | Freq: Once | INTRAMUSCULAR | Status: DC | PRN
Start: 2022-02-13 — End: 2022-02-13

## 2022-02-13 MED ORDER — ONDANSETRON 4 MG PO TBDP
4.0000 mg | ORAL_TABLET | Freq: Three times a day (TID) | ORAL | Status: DC | PRN
Start: 2022-02-13 — End: 2022-02-13

## 2022-02-13 MED ORDER — PROMETHAZINE HCL 25 MG PO TABS
12.5000 mg | ORAL_TABLET | Freq: Four times a day (QID) | ORAL | Status: DC | PRN
Start: 2022-02-13 — End: 2022-02-15

## 2022-02-13 MED ORDER — BISACODYL 10 MG RE SUPP
10.0000 mg | Freq: Every day | RECTAL | Status: DC | PRN
Start: 2022-02-13 — End: 2022-02-15

## 2022-02-13 MED ORDER — LACTATED RINGERS IV SOLN
125.0000 mL/h | INTRAVENOUS | Status: AC
Start: 2022-02-14 — End: 2022-02-14

## 2022-02-13 MED ORDER — SODIUM CHLORIDE 0.9 % IV SOLN
6.2500 mg | Freq: Four times a day (QID) | INTRAVENOUS | Status: DC | PRN
Start: 2022-02-13 — End: 2022-02-15

## 2022-02-13 MED ORDER — SENNOSIDES-DOCUSATE SODIUM 8.6-50 MG PO TABS
1.0000 | ORAL_TABLET | Freq: Every evening | ORAL | Status: DC | PRN
Start: 2022-02-13 — End: 2022-02-15

## 2022-02-13 MED ORDER — SODIUM CHLORIDE 0.9 % IV MBP
1000.0000 mg | INTRAVENOUS | Status: DC
Start: 2022-02-13 — End: 2022-02-13
  Administered 2022-02-13 (×3): 1000 mg via INTRAVENOUS
  Filled 2022-02-13 (×3): qty 1000

## 2022-02-13 MED ORDER — SODIUM CHLORIDE 0.9 % IV MBP
2000.0000 mg | Freq: Once | INTRAVENOUS | Status: AC
Start: 2022-02-13 — End: 2022-02-13
  Administered 2022-02-13: 2000 mg via INTRAVENOUS
  Filled 2022-02-13: qty 2

## 2022-02-13 MED ORDER — NALBUPHINE HCL 10 MG/ML SYRINGE
5.0000 mg | INTRAMUSCULAR | Status: DC | PRN
Start: 2022-02-13 — End: 2022-02-13

## 2022-02-13 MED ORDER — MORPHINE SULFATE 10 MG/ML IJ SOLN
INTRAMUSCULAR | Status: DC | PRN
Start: 2022-02-13 — End: 2022-02-13
  Administered 2022-02-13: 3 mg via INTRAVENOUS

## 2022-02-13 MED ORDER — EPHEDRINE SULFATE 50 MG/ML IJ/IV SOLN (WRAP)
10.0000 mg | Freq: Once | Status: DC | PRN
Start: 2022-02-13 — End: 2022-02-13

## 2022-02-13 MED ORDER — HYDROMORPHONE HCL 1 MG/ML IJ SOLN
0.5000 mg | INTRAMUSCULAR | Status: DC | PRN
Start: 2022-02-13 — End: 2022-02-13

## 2022-02-13 MED ORDER — ONDANSETRON HCL 4 MG/2ML IJ SOLN
INTRAMUSCULAR | Status: AC
Start: 2022-02-13 — End: ?
  Filled 2022-02-13: qty 2

## 2022-02-13 MED ORDER — PROMETHAZINE HCL 25 MG RE SUPP
12.5000 mg | Freq: Four times a day (QID) | RECTAL | Status: DC | PRN
Start: 2022-02-13 — End: 2022-02-13

## 2022-02-13 MED ORDER — OXYTOCIN-SODIUM CHLORIDE 30-0.9 UT/500ML-% IV SOLN
7.5000 [IU]/h | INTRAVENOUS | Status: DC | PRN
Start: 2022-02-13 — End: 2022-02-13

## 2022-02-13 MED ORDER — ONDANSETRON 4 MG PO TBDP
4.0000 mg | ORAL_TABLET | Freq: Three times a day (TID) | ORAL | Status: DC | PRN
Start: 2022-02-13 — End: 2022-02-15

## 2022-02-13 MED ORDER — NALOXONE HCL 0.4 MG/ML IJ SOLN (WRAP)
0.1000 mg | INTRAMUSCULAR | Status: DC | PRN
Start: 2022-02-13 — End: 2022-02-13

## 2022-02-13 MED ORDER — SOD CITRATE-CITRIC ACID 500-334 MG/5ML PO SOLN
30.0000 mL | Freq: Once | ORAL | Status: DC | PRN
Start: 2022-02-13 — End: 2022-02-13

## 2022-02-13 MED ORDER — PRENATAL PLUS IRON 29-1 MG PO TABS
1.0000 | ORAL_TABLET | Freq: Every day | ORAL | Status: DC
Start: 2022-02-14 — End: 2022-02-15
  Filled 2022-02-13 (×2): qty 1

## 2022-02-13 MED ORDER — MORPHINE SULFATE (PF) 1 MG/ML IJ SOLN
INTRAMUSCULAR | Status: AC
Start: 2022-02-13 — End: ?
  Filled 2022-02-13: qty 10

## 2022-02-13 MED ORDER — ONDANSETRON HCL 4 MG/2ML IJ SOLN
4.0000 mg | Freq: Three times a day (TID) | INTRAMUSCULAR | Status: DC | PRN
Start: 2022-02-13 — End: 2022-02-13

## 2022-02-13 MED ORDER — METHYLERGONOVINE MALEATE 0.2 MG/ML IJ SOLN
0.2000 mg | Freq: Four times a day (QID) | INTRAMUSCULAR | Status: DC | PRN
Start: 2022-02-13 — End: 2022-02-15

## 2022-02-13 MED ORDER — FENTANYL CITRATE (PF) 50 MCG/ML IJ SOLN (WRAP)
INTRAMUSCULAR | Status: DC | PRN
Start: 2022-02-13 — End: 2022-02-13
  Administered 2022-02-13: 50 ug via INTRAVENOUS

## 2022-02-13 MED ORDER — IBUPROFEN 800 MG PO TABS
800.0000 mg | ORAL_TABLET | Freq: Three times a day (TID) | ORAL | 0 refills | Status: AC | PRN
Start: 2022-02-13 — End: 2022-02-20

## 2022-02-13 MED ORDER — LIDOCAINE-EPINEPHRINE 2 %-1:200000 IJ SOLN
INTRAMUSCULAR | Status: AC
Start: 2022-02-13 — End: ?
  Filled 2022-02-13: qty 20

## 2022-02-13 MED ORDER — FENTANYL CITRATE (PF) 50 MCG/ML IJ SOLN (WRAP)
INTRAMUSCULAR | Status: AC
Start: 2022-02-13 — End: ?
  Filled 2022-02-13: qty 2

## 2022-02-13 MED ORDER — LANOLIN EX OINT
TOPICAL_OINTMENT | CUTANEOUS | Status: DC | PRN
Start: 2022-02-13 — End: 2022-02-15

## 2022-02-13 MED ORDER — CEFAZOLIN SODIUM 1 G IJ SOLR
INTRAMUSCULAR | Status: AC
Start: 2022-02-13 — End: 2022-02-14
  Filled 2022-02-13: qty 2000

## 2022-02-13 MED ORDER — MEASLES, MUMPS & RUBELLA VAC IJ SOLR
0.5000 mL | INTRAMUSCULAR | Status: DC | PRN
Start: 2022-02-13 — End: 2022-02-15

## 2022-02-13 MED ORDER — SIMETHICONE 80 MG PO CHEW
80.0000 mg | CHEWABLE_TABLET | Freq: Four times a day (QID) | ORAL | Status: DC | PRN
Start: 2022-02-13 — End: 2022-02-15

## 2022-02-13 MED ORDER — FENTANYL-BUPIVACAINE-NACL 0.2-0.125-0.9 MG/100ML-% EP SOLN
EPIDURAL | Status: DC
Start: 2022-02-13 — End: 2022-02-13
  Administered 2022-02-13: 10 mL/h via EPIDURAL
  Filled 2022-02-13: qty 100

## 2022-02-13 MED ORDER — BENZOCAINE 20% +/- MENTHOL 0.5% EX AERO (WRAP)
INHALATION_SPRAY | CUTANEOUS | Status: DC | PRN
Start: 2022-02-13 — End: 2022-02-15

## 2022-02-13 MED ORDER — ONDANSETRON HCL 4 MG/2ML IJ SOLN
INTRAMUSCULAR | Status: DC | PRN
Start: 2022-02-13 — End: 2022-02-13
  Administered 2022-02-13 (×2): 4 mg via INTRAVENOUS

## 2022-02-13 MED ORDER — OXYTOCIN 30 UNITS IN SODIUM CHLORIDE 500 ML (BOLUS)
INTRAVENOUS | Status: DC | PRN
Start: 2022-02-13 — End: 2022-02-13
  Administered 2022-02-13 (×2): 15 [IU] via INTRAVENOUS

## 2022-02-13 MED ORDER — FENTANYL CITRATE (PF) 50 MCG/ML IJ SOLN (WRAP)
25.0000 ug | INTRAMUSCULAR | Status: DC | PRN
Start: 2022-02-13 — End: 2022-02-13

## 2022-02-13 MED ORDER — METHYLERGONOVINE MALEATE 0.2 MG PO TABS
0.2000 mg | ORAL_TABLET | Freq: Four times a day (QID) | ORAL | Status: DC | PRN
Start: 2022-02-13 — End: 2022-02-15

## 2022-02-13 MED ORDER — PROMETHAZINE HCL 25 MG/ML IJ SOLN
6.2500 mg | INTRAMUSCULAR | Status: DC | PRN
Start: 2022-02-13 — End: 2022-02-15

## 2022-02-13 MED ORDER — OXYTOCIN-SODIUM CHLORIDE 30-0.9 UT/500ML-% IV SOLN
2.0000 m[IU]/min | INTRAVENOUS | Status: DC | PRN
Start: 2022-02-13 — End: 2022-02-13
  Administered 2022-02-13: 2 m[IU]/min via INTRAVENOUS
  Filled 2022-02-13: qty 500

## 2022-02-13 MED ORDER — FAMOTIDINE 10 MG/ML IV SOLN (WRAP)
20.0000 mg | Freq: Once | INTRAVENOUS | Status: DC | PRN
Start: 2022-02-13 — End: 2022-02-13

## 2022-02-13 MED ORDER — LACTATED RINGERS IV SOLN
125.0000 mL/h | INTRAVENOUS | Status: DC
Start: 2022-02-13 — End: 2022-02-13

## 2022-02-13 MED ORDER — MAGNESIUM HYDROXIDE 400 MG/5ML PO SUSP
30.0000 mL | Freq: Four times a day (QID) | ORAL | Status: DC | PRN
Start: 2022-02-13 — End: 2022-02-15

## 2022-02-13 MED ORDER — LIDOCAINE-EPINEPHRINE 1.5 %-1:200000 IJ SOLN
INTRAMUSCULAR | Status: DC | PRN
Start: 2022-02-13 — End: 2022-02-13
  Administered 2022-02-13: 3 mL via EPIDURAL
  Administered 2022-02-13: 5 mL via EPIDURAL

## 2022-02-13 MED ORDER — HYDROMORPHONE HCL 1 MG/ML IJ SOLN
0.5000 mg | INTRAMUSCULAR | Status: DC | PRN
Start: 2022-02-13 — End: 2022-02-15

## 2022-02-13 MED ORDER — HYDROCORTISONE 1 % EX OINT
TOPICAL_OINTMENT | Freq: Three times a day (TID) | CUTANEOUS | Status: DC | PRN
Start: 2022-02-13 — End: 2022-02-15

## 2022-02-13 MED ORDER — PROMETHAZINE HCL 25 MG PO TABS
12.5000 mg | ORAL_TABLET | Freq: Four times a day (QID) | ORAL | Status: DC | PRN
Start: 2022-02-13 — End: 2022-02-13

## 2022-02-13 MED ORDER — ACETAMINOPHEN 500 MG PO TABS
1000.0000 mg | ORAL_TABLET | Freq: Once | ORAL | Status: AC
Start: 2022-02-13 — End: 2022-02-13
  Administered 2022-02-13: 1000 mg via ORAL
  Filled 2022-02-13: qty 2

## 2022-02-13 MED ORDER — LACTATED RINGERS IV SOLN
INTRAVENOUS | Status: DC
Start: 2022-02-13 — End: 2022-02-13

## 2022-02-13 MED ORDER — TETANUS-DIPHTH-ACELL PERTUSSIS 5-2.5-18.5 LF-MCG/0.5 IM SUSP/SUSY (WR)
0.5000 mL | INTRAMUSCULAR | Status: DC | PRN
Start: 2022-02-13 — End: 2022-02-15

## 2022-02-13 MED ORDER — PROMETHAZINE HCL 25 MG RE SUPP
12.5000 mg | Freq: Four times a day (QID) | RECTAL | Status: DC | PRN
Start: 2022-02-13 — End: 2022-02-15

## 2022-02-13 MED ORDER — NALBUPHINE HCL 10 MG/ML SYRINGE
5.0000 mg | Freq: Four times a day (QID) | INTRAMUSCULAR | Status: DC | PRN
Start: 2022-02-13 — End: 2022-02-15
  Administered 2022-02-13 – 2022-02-14 (×3): 5 mg via INTRAVENOUS
  Filled 2022-02-13 (×3): qty 1

## 2022-02-13 MED ORDER — SODIUM CHLORIDE (PF) 0.9 % IJ SOLN
3.0000 mL | Freq: Three times a day (TID) | INTRAMUSCULAR | Status: DC
Start: 2022-02-15 — End: 2022-02-15

## 2022-02-13 MED ORDER — IBUPROFEN 400 MG PO TABS
800.0000 mg | ORAL_TABLET | Freq: Three times a day (TID) | ORAL | Status: DC
Start: 2022-02-14 — End: 2022-02-15
  Administered 2022-02-14 – 2022-02-15 (×3): 800 mg via ORAL
  Filled 2022-02-13 (×3): qty 2

## 2022-02-13 MED ORDER — MISOPROSTOL 200 MCG PO TABS
800.0000 ug | ORAL_TABLET | Freq: Once | ORAL | Status: DC | PRN
Start: 2022-02-13 — End: 2022-02-13

## 2022-02-13 MED ORDER — OXYTOCIN-SODIUM CHLORIDE 30-0.9 UT/500ML-% IV SOLN
7.5000 [IU]/h | INTRAVENOUS | Status: DC | PRN
Start: 2022-02-14 — End: 2022-02-13

## 2022-02-13 MED ORDER — ACETAMINOPHEN 500 MG PO TABS
1000.0000 mg | ORAL_TABLET | Freq: Three times a day (TID) | ORAL | 0 refills | Status: AC | PRN
Start: 2022-02-13 — End: 2022-02-20

## 2022-02-13 MED ORDER — NALOXONE HCL 0.4 MG/ML IJ SOLN (WRAP)
0.2000 mg | INTRAMUSCULAR | Status: DC | PRN
Start: 2022-02-13 — End: 2022-02-15

## 2022-02-13 MED ORDER — DEXAMETHASONE SODIUM PHOSPHATE 4 MG/ML IJ SOLN (WRAP)
INTRAMUSCULAR | Status: DC | PRN
Start: 2022-02-13 — End: 2022-02-13
  Administered 2022-02-13: 4 mg via INTRAVENOUS

## 2022-02-13 MED ORDER — OXYCODONE HCL 5 MG PO TABS
5.0000 mg | ORAL_TABLET | ORAL | Status: DC | PRN
Start: 2022-02-13 — End: 2022-02-15

## 2022-02-13 MED ORDER — ACETAMINOPHEN 500 MG PO TABS
1000.0000 mg | ORAL_TABLET | Freq: Three times a day (TID) | ORAL | Status: DC
Start: 2022-02-13 — End: 2022-02-15
  Administered 2022-02-14 – 2022-02-15 (×4): 1000 mg via ORAL
  Filled 2022-02-13 (×4): qty 2

## 2022-02-13 MED ORDER — ONDANSETRON HCL 4 MG/2ML IJ SOLN
4.0000 mg | Freq: Once | INTRAMUSCULAR | Status: DC
Start: 2022-02-13 — End: 2022-02-13

## 2022-02-13 MED ORDER — PROMETHAZINE HCL 25 MG/ML IJ SOLN
6.2500 mg | Freq: Once | INTRAMUSCULAR | Status: DC | PRN
Start: 2022-02-13 — End: 2022-02-13

## 2022-02-13 MED ORDER — NIFEDIPINE 10 MG PO CAPS
10.0000 mg | ORAL_CAPSULE | Freq: Once | ORAL | Status: DC | PRN
Start: 2022-02-13 — End: 2022-02-15

## 2022-02-13 MED ORDER — METOCLOPRAMIDE HCL 5 MG/ML IJ SOLN
10.0000 mg | Freq: Once | INTRAMUSCULAR | Status: DC | PRN
Start: 2022-02-13 — End: 2022-02-15

## 2022-02-13 MED ORDER — MISOPROSTOL 200 MCG PO TABS
800.0000 ug | ORAL_TABLET | Freq: Once | ORAL | Status: DC | PRN
Start: 2022-02-13 — End: 2022-02-15

## 2022-02-13 MED ORDER — FAMOTIDINE 20 MG PO TABS
20.0000 mg | ORAL_TABLET | Freq: Once | ORAL | Status: AC
Start: 2022-02-13 — End: 2022-02-13
  Administered 2022-02-13: 20 mg via ORAL
  Filled 2022-02-13: qty 1

## 2022-02-13 MED ORDER — NIFEDIPINE 10 MG PO CAPS
20.0000 mg | ORAL_CAPSULE | Freq: Once | ORAL | Status: DC | PRN
Start: 2022-02-13 — End: 2022-02-15

## 2022-02-13 MED ORDER — DOCUSATE SODIUM 100 MG PO CAPS
200.0000 mg | ORAL_CAPSULE | Freq: Two times a day (BID) | ORAL | Status: DC
Start: 2022-02-13 — End: 2022-02-15
  Administered 2022-02-14 – 2022-02-15 (×3): 200 mg via ORAL
  Filled 2022-02-13 (×3): qty 2

## 2022-02-13 MED ORDER — OXYCODONE-ACETAMINOPHEN 5-325 MG PO TABS
1.0000 | ORAL_TABLET | Freq: Three times a day (TID) | ORAL | 0 refills | Status: AC | PRN
Start: 2022-02-13 — End: 2022-02-17

## 2022-02-13 SURGICAL SUPPLY — 78 items
ADHESIVE SKIN CLOSURE DERMABOND MINI .36 (Suture) ×1
ADHESIVE SKIN CLOSURE DERMABOND MINI .36 ML LIQUID APPLICATOR (Suture) ×1 IMPLANT
ADHESIVE SKIN CLOSURE SWIFTSET .8 ML (Skin Closure)
ADHESIVE SKIN CLSR SWIFTSET .8 ML ANTIMICROBIAL BARRIER CLOGFREE VLT (Skin Closure) IMPLANT
ADHESIVE SKNCLS .8ML SWIFTSET STRL (Skin Closure)
ADHESIVE SKNCLS 2 OCTYL CYNCRLT .36ML MN (Suture) ×1
BARRIER ADH GNCR INC 4X3IN STRL ABS (Mesh) ×1 IMPLANT
BARRIER ADHESION L4 IN X W3 IN GYNECARE (Mesh) ×1 IMPLANT
BARRIER ADHESION L4 IN X W3 IN GYNECARE INTERCEED PELVIC ABSORBABLE (Mesh) ×1 IMPLANT
BLANKET WARMING L60 IN X W36 IN BAIR (Patient Supply) ×1 IMPLANT
BLANKET WRM PLMR BR HGR 60X36IN LF NS FT (Patient Supply) ×1
CANISTER SCT FM 3L HFLO AQUILEX ARST LF (Suction) ×1
CANISTER SUCTION 3 L FILTER FLOAT VALVE (Suction) ×1
CANISTER SUCTION 3 L FILTER FLOAT VALVE SHUTOFF GUARD POUR SPOUT (Suction) ×1 IMPLANT
CATHETER INTRAUTERINE BAKRI OD24 FR L54 (Balloons)
CATHETER INTRAUTERINE BAKRI OD24 FR L54 CM BALLOON SYRINGE RAPID (Balloons) IMPLANT
CATHETER IU SIL 500ML 60ML BAKRI 24FR 54 (Balloons)
DEVICE VAC XTRT PALMPUMP KW OCP FLXB STM (Instrument)
DEVICE VACUUM EXTRACTION FLEXIBLE STEM (Instrument)
DEVICE VACUUM EXTRACTION FLEXIBLE STEM LOW PROFILE CUP TRACTION FORCE (Instrument) IMPLANT
DRESSING ISLAND TELFA 4X10 (Dressing) ×2 IMPLANT
FILTER SMKEVC ULT LO PEN AIR HOSE (Suction) ×1
FILTER SMOKE EVACUATOR ULTRA LOW (Suction) ×1
FILTER SMOKE EVACUATOR ULTRA LOW PENETRATION AIR HOSE (Suction) ×1 IMPLANT
GLOVE SRG 6.5 BGL M LTX STRL PF TXTR (Glove) ×1
GLOVE SURGICAL 6 1/2 BIOGEL M POWDER (Glove) ×1
GLOVE SURGICAL 6 1/2 BIOGEL M POWDER FREE TEXTURE BEAD CUFF (Glove) ×1 IMPLANT
INSTRUMENT SUCTION NON VENTED LARGE EYED SHEATH POOLE 0035040 (Tubes) IMPLANT
PEN SRGMRK CNVRT MULTITIP NTOX (Other)
PEN SURGICAL MARKING MULTITIP NONTOXIC (Other) IMPLANT
PENCIL SMKEVC LF COAT PSHBTN (Cautery) ×1
PENCIL SMOKE EVACUATOR COATED PUSH (Cautery) ×1
PENCIL SMOKE EVACUATOR COATED PUSH BUTTON NEPTUNE E-SEP (Cautery) ×1 IMPLANT
PROTECTOR TISS LG ALEXIS LF FLXB RTRCT (Instrument) ×1
PROTECTOR TISS XL ALEXIS LF FLXB RTRCT (Retractor)
PROTECTOR TISSUE LARGE FLEXIBLE (Instrument) ×1
PROTECTOR TISSUE LARGE FLEXIBLE RETRACTION RING ATRAUMATIC SELF RETAIN (Instrument) ×1 IMPLANT
PROTECTOR TISSUE XL FLEXIBLE RETRACTION (Retractor)
PROTECTOR TISSUE XL FLEXIBLE RETRACTION RING ATRAUMATIC SELF RETAIN (Retractor) IMPLANT
SLEEVE CMPR MED KN LGTH KDL SCD 21- IN (Sleeve) ×2
SLEEVE COMPRESSION MEDIUM KNEE LENGTH KENDALL SEQUENTIAL OD21- IN (Sleeve) ×1 IMPLANT
SOLUTION IRR 0.9% NACL 1000ML LF STRL (Irrigation Solutions) ×2
SOLUTION IRRIGATION 0.9% SODIUM CHLORIDE (Irrigation Solutions) ×2
SOLUTION IRRIGATION 0.9% SODIUM CHLORIDE 1000 ML PLASTIC POUR BOTTLE (Irrigation Solutions) ×2 IMPLANT
SPONGE LAP 18X18IN PREWASH WHT (Sponge)
SPONGE LAP CTTN 18X18IN LF STRL 4 PLY (Sponge) ×1
SPONGE LAPAROTOMY L18 IN X W18 IN (Sponge)
SPONGE LAPAROTOMY L18 IN X W18 IN 4 PLY (Sponge) ×1
SPONGE LAPAROTOMY L18 IN X W18 IN 4 PLY RADIOPAQUE PYRONEMA FREE HIGH (Sponge) ×1 IMPLANT
SPONGE LAPAROTOMY L18 IN X W18 IN PREWASH WHITE (Sponge) IMPLANT
STRAP PSTN VLCR DVN 60X3IN LF KN DISP (Procedure Accessories) ×2 IMPLANT
STRIP SKIN CLOSURE L4 IN X W1/2 IN (Dressing)
STRIP SKIN CLOSURE L4 IN X W1/2 IN REINFORCE STERI-STRIP POLYESTER (Dressing) IMPLANT
STRIP SKNCLS PLSTR STRSTRP 4X.5IN LF (Dressing)
SUTURE ABS 1 CT PDS2 36IN MFL VIOL (Suture) ×2
SUTURE ABS 3-0 CT1 VCL 36IN BRD COAT UD (Suture) ×1
SUTURE ABS 3-0 KS MNCRL 27IN MFL UD (Suture)
SUTURE ABS 4-0 PS2 MNCRL MTPS 27IN MFL (Suture)
SUTURE COATED VICRYL 3-0 CT-1 L36 IN (Suture) ×1
SUTURE COATED VICRYL 3-0 CT-1 L36 IN BRAID COATED UNDYED ABSORBABLE (Suture) ×1 IMPLANT
SUTURE MONOCRYL 3-0 KS L27 IN (Suture)
SUTURE MONOCRYL 3-0 KS L27 IN MONOFILAMENT UNDYED ABSORBABLE (Suture) IMPLANT
SUTURE MONOCRYL 4-0 PS-2 L27 IN (Suture)
SUTURE MONOCRYL 4-0 PS-2 L27 IN MONOFILAMENT UNDYED ABSORBABLE (Suture) IMPLANT
SUTURE PDS II 1 CT L36 IN MONOFILAMENT (Suture) ×2
SUTURE PDS II 1 CT L36 IN MONOFILAMENT VIOLET ABSORBABLE (Suture) ×2 IMPLANT
SYRINGE CATH TIP 50ML LF STRL GRAD (Syringes, Needles)
SYRINGE CATHETER TIP GRADUATED (Syringes, Needles)
SYRINGE CATHETER TIP GRADUATED NONPYROGENIC DEHP FREE PVC FREE 50 ML (Syringes, Needles) IMPLANT
SYSTEM DELIVERY BALLOON CEPHALIC (Balloons)
SYSTEM DELIVERY BALLOON CEPHALIC ELEVATION FETAL PILLOW CESAREAN (Balloons) IMPLANT
SYSTEM DLV FTL PLW BLN CEPHALIC ELEV (Balloons)
TRAY 1LYR 14FR 10ML LATEX DB SAFES (Tray) ×2 IMPLANT
TRAY CATH PVP SIL 16FR LTX 1 LYR FL STRG (Tray)
TRAY CATHETERIZATION OD16 FR 1 LAYER (Tray) IMPLANT
TRAY SRG C-SECTION IFOH (Pack) ×2
TRAY SURGICAL C BIRTH (Pack) ×1 IMPLANT
TUBING POOLE SUCTION DEVICE (Tubes)

## 2022-02-13 NOTE — Plan of Care (Signed)
Problem: Safety  Goal: Patient will be free from injury during hospitalization  Outcome: Progressing  Goal: Patient will be free from infection during hospitalization  Outcome: Progressing

## 2022-02-13 NOTE — Progress Notes (Signed)
Pt requesting pain meds  Discouraged use of pain meds in the IV at this time  She is coping well with labor  Pain meds will flatten out the baby's variability   With the intermittent Cat 2, the good variability is preventing [place] from worrying about the tracing    Her cervix is 5/80/-1 which is good change from 2 hours ago    I recommend getting in the shower  Also, call in her doula  I recommend avoiding epidural and IV pain meds if at all possible  We can use nitrous if she can dilate a little further     We are going to apply the monica   This way she can move around more and use the shower  Starting Ampicillin for GBS prophylaxis as well    Cat 1 FHR for the past hour  Baseline 145  Accels present  Decels absent  Moderate variability    Anticipate SVD early morning

## 2022-02-13 NOTE — Anesthesia Postprocedure Evaluation (Signed)
Anesthesia Post Evaluation    Patient: [name]    Procedure(s):  CESAREAN SECTION    Anesthesia type: epidural    Last Vitals:   Vitals Value Taken Time   BP 116/79 02/13/22 1934   Temp 36.9 C (98.5 F) 02/13/22 1915   Pulse 103 02/13/22 1934   Resp 21 02/13/22 1934   SpO2 97 % 02/13/22 1934                 Anesthesia Post Evaluation:     Patient Evaluated: PACU  Patient Participation: complete - patient participated  Level of Consciousness: awake and alert  Pain Score: 0  Pain Management: adequate    Airway Patency: patent    Anesthetic complications: No      PONV Status: none    Cardiovascular status: acceptable  Respiratory status: acceptable  Hydration status: acceptable  Comments: Pt is doing very well. She is sitting up, doing well.  Holding her baby.  No post c-section anesthesia issues.         Signed by: Renaldo Fiddler, MD, 02/13/2022 7:45 PM

## 2022-02-13 NOTE — Progress Notes (Signed)
Cook placed 1 hour ago  Expelled when she was up to use bathroom

## 2022-02-13 NOTE — Progress Notes (Signed)
Pt in the shower from 0140-0208.

## 2022-02-13 NOTE — Plan of Care (Signed)
Labor & Delivery Progress Note    Subjective:  Patient resting in bed. Comfortable with epidural in place, on hands and knees currently. Denying VB, abdominal pain.  MD to bedside to discuss plan of care with CNM M. Torres    Objective:  Visit Vitals  BP 133/90   Pulse 99   Temp 98.3 F (36.8 C) (Axillary)   Resp 18   Ht 1.676 m (5\' 6" )   Wt 72.5 kg (159 lb 13.3 oz)   LMP 04/26/2021   SpO2 100%   BMI 25.80 kg/m     NAD  Normal WOB  Abd soft, nontender    SVE: 6/80/-1, unchanged since ROM >6h ago   FHR baseline 135, moderate variability, +accels, + intermittent late decels  External toco with contractions q 2-30min, variable ctx pattern      Assessment/Plan:  31 y.o. G2P00010 at [redacted]w[redacted]d, admitted as TOC from home birth center for augmentation. Since arrival, we have had on and off Cat II FHT with rounds of late decelerations that resolve with repositioning.   Her cervix has remained unchanged at 6cm dilation since 8h ago, and she has now had 6h since AROM without any change in cervix, meeting criteria at this time for AOD in first stage of labor.   Currently Cat 1 FHT, will discontinue pitocin  The pt, her husband Greig Castilla, [place] and doula are all in agreement with the plan  To OR for primary CS  Ancef for ppx    Luanne Bras, MD

## 2022-02-13 NOTE — Anesthesia Procedure Notes (Signed)
Epidural    Patient location during procedure: L&D  Reason for block: Labor or C-section  Block at Surgeon's request: Yes    Start time: 02/13/2022 10:47 AM  End time: 02/13/2022 10:55 AM    Staffing  Performed: resident/CRNA   Anesthesiologist: Renaldo Fiddler, MD  Resident/CRNA: Mickel Crow, CRNA  Performed by: Mickel Crow, CRNA  Authorized by: Renaldo Fiddler, MD      Pre-procedure Checklist   Completed: patient identified, site marked, surgical consent, pre-op evaluation, timeout performed, risks and benefits discussed, monitors and equipment checked, anesthesia consent given and correct site  Timeout Completed:  02/13/2022 2:06 PM    Epidural  Patient monitoring: NIBP    Premedication: No  Patient position: sitting    Skin Local: lidocaine 1%  Dose: 3 mL    Attempts  Number of attempts: 1                    Successful attempt  Interspace: L3-4  Approach: midline    Needle type: Touhy needle   Needle gauge: 17  Injection technique: LOR saline  Epidural Space ID: 4.5 cm  CSF Return: No   Blood Return: No  Paresthesia Pain: no    Needle Placement  Needle type: Touhy needle   Needle gauge: 17  Injection technique: LOR saline  CSF Return: No  Blood Return: No          Paresthesia Pain: no    Catheter Placement   Catheter type: side hole  Catheter size: 18 G  Catheter at skin depth: 10 cm  CSF Return: No  Blood Return: No  Test Dose:other and negative  3 cc  Incremental injection: yes  Injection made incrementally with aspirations every 2 mL.    No Catheter IV/SA Signs or Symptoms    Assessment   Sensory level: T6  Patient tolerated procedure well: Yes  Block Outcome: patient tolerated procedure well, no complications, successful block and pain improved  Additional Notes  Sterile procedure, easy placement.

## 2022-02-13 NOTE — Transfer of Care (Signed)
Pt awake, breathing spontaneously, in NAD.  VSS. Report given to RN.

## 2022-02-13 NOTE — Progress Notes (Signed)
Received report; pt care assumed at this time.

## 2022-02-13 NOTE — Progress Notes (Signed)
Cytotec discontinued after first dose.  FHR tracing shows intermittent late decels  Variability has remained moderate throughout  Accels are noted periodically as well (but not during periods of lates)    Due to that, I explained to [name] that we want to move forward with the induction, but need to use methods that are more easily removed or reversible.  I initially recommended pitocin, but after discussion with her doula they were asking for [facility] catheter instead.    Her cervix is a very soft 2/70/-1  I placed a Adriana Simas easily and suspect it will expel soon!  60/60cc sterile water in each balloon.    At the time of placement, the FHR tracing was Cat 1   The last time it was cat 2 was 2212 when she had 2 late decels in a row.  Prior to that, it was Cat 1 with accels    Discussed possibility that post dates placenta will necessitate a CS for baby's health.  However, we will continue slow induction process in hopes for vaginal birth.  Both her and her husband understand.  His mother is also here in support.

## 2022-02-13 NOTE — Nursing Progress Note (Signed)
Pt called requesting IV pain medication. Called Lupita Dawn CNM regarding pt request and for orders. CNM states due to intermittent cat 2 EFM she does not feel comfortable ordering any IV pain medication at this time. CNM states she is en route to room to discuss this with patient.

## 2022-02-13 NOTE — Op Note (Signed)
FULL OPERATIVE NOTE    NOTICE FOR THE PATIENT: This clinical note is not written in a way or designed to be interpreted by patients or persons who are not trained in clinical medicine, and we do not recommend reading it unless you have medical training. These notes may contain candid medical descriptions, which may be (unintentionally) perceived as offensive, which are sometimes required for accurate clinical documentation and medical decision-making. In addition, requests for modification of clinical documentation cannot be entertained (unless there is an error of fact). If you would like more information about your healthcare, please obtain it directly from myself or my medical and clinical colleagues and staff - never solely from office or hospital notes. Thank you for your understanding and cooperation.    Date Time: 02/13/22 7:17 PM  Patient Name: [name]  Attending Physician: Luanne Bras, MD    Date of Operation:   02/13/22     Providers Performing:   Surgeon(s): Luanne Bras, MD     Surgeon(s):  Luanne Bras, MD    Circulator: Natale Lay, RN  Scrub Person: Geoffry Paradise  First Assistant: Gevena Mart, RN    Operative Procedure:   Procedure(s):  CESAREAN SECTION    Preoperative Diagnosis:   42 week 0 day pregnancy.  Arrest of dilation in first stage of labor  GBS positive status    Postoperative Diagnosis:   42 week 0 day pregnancy.  Arrest of dilation in first stage of labor  GBS positive status  S/p primary low transverse cesarean section    Indications:   failure to progress: arrest of dilation    Anesthesia:   Epidural anesthesia    Findings:   Normal appearing uterus and uterine outline. Small 2cm subserosal fibroids on anterior and posterior uterus x2  Normal appearing bilateral fallopian tubes and ovaries.  Viable female infant in ROT presentation.     Procedure Details:   The patient was seen in the L&D Room. The risks, benefits, complications, treatment  options, and expected outcomes were discussed with the patient.  The patient concurred with the proposed plan, giving informed consent.  The site of surgery properly noted/marked. The patient was taken to the operating room, identified as [name] and the procedure verified as C-Section Delivery. Epidural anesthesia was bolused. Once an adequate level of anesthesia was attained, the patient was placed in a supine position with a lateral tilt and Foley catheter was already in place to drain the bladder. She was then prepped and draped in the usual sterile fashion. A surgical Time Out was held and the above information confirmed. Ancef 2g and azithromycin 500mg  was administered perioperatively.    A Pfannenstiel incision was made approximately 2cm above the pubic symphysis and carried down through the subcutaneous tissue to the fascia. Fascial incision was made and extended transversely. The fascia was separated from the underlying rectus tissue superiorly and inferiorly. The peritoneum was identified and entered. Peritoneal incision was extended longitudinally using care to avoid damage to the bladder.     Inspection of the uterus revealed it to be in the midposition and slightly levorotated. An Alexis self-retaining retractor was placed. The utero-vesical peritoneal reflection was incised transversely and the bladder flap was bluntly freed from the lower uterine segment. A low transverse uterine incision was made and hysterotomy was extended bluntly in a superior-inferior fashion. Membranes previously ruptured, clear fluid noted. A hand was placed in the lower uterine segment, and the fetal head was elevated out  the pelvis and remainder of infant body was delivered through the hysterotomy without issue. Delivered a viable female infant from ROT presentation, of birth weight 4220 gram (9lb 4.9oz) with Apgar scores of 8 at one minute and 9 at five minutes. After the umbilical cord was clamped and cut, cord  blood was obtained for routine evaluation. Infant was handed off to awaiting Peds staff. The placenta was manually extracted and noted to be intact. Uterine cavity was gently wiped with moist laparotomy sponges to remove any residual membranes or blood clots within the cavity. Uterus was then exteriorized and was noted to be firm with uterine massage and initiation of IV pitocin. TXA 1g was administered by anesthesia staff at time of delivery of placenta given length of labor and concern for potential postpartum hemorrhage.    The uterine outline, tubes and ovaries appeared normal with two small subserosal fibroids as above. Inspection of the uterine incision revealed no extensions. The uterine incision was closed with running locked sutures of 0 Vicryl and a second imbricating layer was also performed. Hemostasis was observed. Uterus was replaced into abdominopelvic cavity. Alexis retractor was removed from the pelvis. Interceed was placed over the hysterotomy. The pericolic gutters were cleansed of any blood clots. The peritoneum was re-approximated with 2-0 vicryl. The muscle was reapproximated with 2-0 vicryl. The fascia was then reapproximated with two sutures of 0-Vicryl on CTX needle, each running from the lateral aspects of the incision to the midline. The subcutaneous tissues were irrigated with normal saline, and adequate hemostasis was achieved with Bovie prior to closure of the subcutaneous tissue with a running suture of 2-0 plain gut. The skin was reapproximated with subcuticular running stitch of 4-0 monocryl.    Instrument, sponge, and needle counts were correct prior the abdominal closure and at the conclusion of the case. Both patient and infant tolerated procedure well.     Quantitative Blood Loss:   455 mL     Urine output:   375 cc clear yellow urine    Total IV fluids:     Current Facility-Administered Medications   Medication    fentaNYL 2 mcg/mL + bupivacaine 0.125 %    lactated ringers     lactated ringers    oxytocin    Followed by    Melene Muller ON 02/14/2022] oxytocin       Implants:     Implant Name Type Inv. Item Serial No. Model No. Manufacturer Lot No. LRB No. Used Action   [employer] 4X3IN STRL ABS - [id number] Mesh [employer] 4X3IN STRL ABS  [id number] [employer] [id number] N/A 1 Implanted       Drains:   Drains: no    Specimens:   * No order type specified *     Disposition:   PACU - hemodynamically stable    Complications:   None    Signed by: Luanne Bras, MD  [place] [facility]

## 2022-02-13 NOTE — Progress Notes (Signed)
Pt sitting up for epidural

## 2022-02-13 NOTE — Progress Notes (Signed)
MOM     31 y.o.  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    Group B streptococcal infection in pregnancy        Past Surgical History:   Procedure Laterality Date    BIOPSY, BREAST, TUMOR EXCISION, ULTRASOUND NEEDLE LOCALIZATION Left 02/24/2017    Procedure: BIOPSY, BREAST, TUMOR EXCISION, ULTRASOUND NEEDLE LOCALIZATION;  Surgeon: Tilda Burrow, MD;  Location: [facility];  Service: General;  Laterality: Left;  LEFT BREAST NLOC EXCISIONAL BIOPSY WITH INTRA-OP WIRE LOCALIZATION    BREAST BIOPSY         Pertinent prenatal Labs -   Lab Results   Component Value Date    ABORH O POS 02/12/2022    HEPBSAG Negative 06/22/2021    GBS Positive 01/05/2022    RUBELLAABIGG Immune 06/22/2021    RPR Non Reactive 06/22/2021       Interpreter Waiver Signed: N/A    Delivering Clinician:     Dr.Shustarovich  Delivery Type:     Cesarean   Delivery Date and Time: 02/13/2022  6:19 PM     Delivery Complications: none    Rupture Date - 02/13/2022   Rupture time - 10:19 AM   Fluid color - Clear     Laceration (Yes/None):     n/a  Laceration Type:     None   Episiotomy:     None      Anesthesia: Epidural      ADDITIONAL COMMENTS:    Prenatal Scanned into Epic: Yes    ERAC protocol started Yes      Other Medications given in L&D (list ERAC medications if applicable)- Pepcid, tylenol, Ancef, Zithromax, TXA, Pit x 1    Any SSRI medications-none    Recent Output- foley in place     Fundus- firm at U  Lochia- small      EBL:  ,455   QBL:    Pain Score: 0    Prolonged 2nd stage of labor  No       Precipitous labor < 3 hours  Yes     Prolong Oxytocin use  Yes       Patient up with SARASTEADY -  N/A            BABY    [employer]- [phone]     Baby Safety Contract Signed Yes       Cesarean  of live female  infant 9 lb 4.9 oz (4220 g)      1 Minute  Apgar:     8      5 Minute Apgar:     9     Nuchal Cord: No    Fenton Growth Scale Percentile: 65.5   >90% or <10% No  ; Hypoglycemia Protocol Initiated: No      Glucose gel given No   Number of Doses Given N/A  PCOT Initial     //   PCOT 1 Hr     Coombs Results: pending    Other Protocols on Newborn: none    ADDITIONAL COMMENTS:  Feeding: Breast feeding  Skin-to-skin initiated Yes               Labs due- none  Cord blood-  Lab/Blood Bank   Eyes & Thighs: done    Void - No   Stool - 0   Circumcision No    Pediatrician Notification:  If not house pediatrician, was private Pediatrician Notified: N/A, peds notification form filled out Yes    At home Pediatrician: [id number]  Hospital Pediatrician: [facility]      Bedside report given to K. Leavy Cella, RN using ISHAPED report. Pt care relinquished.    Natale Lay, RN  02/13/2022

## 2022-02-13 NOTE — Progress Notes (Signed)
Labor slowed overnight   Shayona was able to sleep/rest some  She was started on pitocin at 0340   Now infusing at 8 mu/min  FHR Cat 1 since starting   Moderate variability  Baseline  135  Accels present   No decels    Membranes remain intact  Report off to Rosario Jacks, CNM

## 2022-02-13 NOTE — Progress Notes (Signed)
Fetal heart rate tracing strip reviewed.  Periods of category 2 tracing.  Always maintaining moderate variability as well as reactivity.  She does have some periods of recurrent late decelerations.  Additionally at times appears to have a wandering baseline.  Overall given the moderate variability and consistent reactivity of the tracing, overall this category 2 tracing does have reassuring characteristics.  At this time no suspected fe tal metabolic acidosis is suspected, although I do suspect a degree of placental insufficiency, most likely due to her gestational age.    Continue CNM lead care with induction to attempt vaginal delivery..  We will continue to monitor with continuous electronic fetal monitoring.  I am available for collaboration with CNM Sallyanne Havers for this patient who desires seen in CNM care.

## 2022-02-13 NOTE — Discharge Instr - AVS First Page (Addendum)
Reason for your Hospital Admission  childbirth    Instructions for after your discharge:    These discharge  instructions come from [facility] care booklet you received upon admission.  Please refer to the specific pages for more information.        Please schedule your follow- up appointment as directed by your doctor.    Basic Care  Your body will take 6-8 weeks to heal from childbirth. You may feel cramps or "afterbirth" pains during the first few days following delivery, this is normal and means the uterus is decreasing to its pre-pregnancy size. (See Page 2)    Maintaining good hygiene helps prevent infection. (See Page 3)    Vaginal bleeding can last from 4-6 weeks. Bleeding may increase with activity. Do not put anything into your vagina including tampons. (See Page 3)    Call your doctor immediately if you are saturating 2 or more pads an hour, gushing blood or passing clots larger than a golf ball size. (See Page 3)    It is important to keep your perineum area clean to prevent infection, promote healing and increase comfort. (See Page 3)    Incision Care after Cesarean Section (See Page 3)    It is important to empty your bladder every 2-4 hours for the first few days postpartum. (See Page 4)    Your first bowel movement should occur 2-3 days following delivery. (See Page 4)    For signs of illness and when to call your doctor immediately: (page 4)    Foul-smelling vaginal bleeding or discharge    Large clots (golf-ball size or larger)    Vaginal bleeding that saturates two or more pads in one hour    A fever of 100.4 degrees F or higher     Redness, severe pain or a lump in either breast     Flu-like symptoms     Tenderness or pain in your lower legs, especially when walking     Overwhelming feelings of sadness, anger or inability to cope    Pain or burning when urinating     Separation or oozing of any sutured area (cesarean or tubal incision, episiotomy or tear)     Other symptoms you do not  understand      Rest and Activity  Rest is essential to a smooth recovery following delivery. (See Page 4)    Exercise can help promote a healthy recovery, See Page 4 for a non-stressful exercise routine to follow.    It is not uncommon to experience a temporary depression after the birth of your baby, for information on Postpartum Blues see Page 5.    Nutrition  Continue your prenatal vitamins while you are breastfeeding or formula feeding. (See Page 5)    You can find additional nutrition tips on Page 5.    Sexuality  Talk to your healthcare provider about when it is okay to resume sexual activity. (See page 6)    Postpartum Blues    It is not uncommon to experience a temporary depression after the birth of your baby. Getting plenty of rest, eating a well-balanced diet, and sharing your feelings with your partner can help.     Here are some signals:         Inability to perform daily activities, such as showering or getting dressed         Crying spells         Eating very little           Insomnia or excessive sleep         Inability to respond to your baby's basic needs       Family Planning and Contraception  It is important to use a method of family planning if you do not want to become pregnant. For Methods of Contraception see Page 7.    If you are not breastfeeding menstruation usually occurs 7-9 weeks after delivery. If you are nursing exclusively, you may not have a period for several weeks or months. (See page 7)    Information on breastfeeding including how to avoid and deal with engorgement and sore/cracked nipples can be found on Page 8-13.      Information on infant care and safety can be found on Page 14-21.        Understanding Postpartum Depression  It's common for new moms to feel tired and moody after having a baby. But if you have very strong feelings of sadness or anger, or have trouble doing your normal daily tasks, you may have a serious mood disorder called postpartum depression. Left  untreated, it may stop you from bonding with your baby. It's important to see your healthcare provider right away and get help.   What is postpartum depression?  Postpartum depression is a serious mood disorder that affects some women after giving birth. About 3 in 20 new moms have this condition. Symptoms can include feeling very intense sadness, tiredness, and anxiety. These feelings can become so strong that it's hard to do your normal daily tasks. You may find it hard to take care of yourself and your baby.   Symptoms may start about 1 to 3 weeks after your baby is born. But they can also appear up to a year later. If you or the people around you think you may have postpartum depression, or if you have symptoms for more than 2 weeks, call your healthcare provider right away. Treatment is available.   Is it postpartum depression or the baby blues?  Postpartum depression is different than the "baby blues" (postpartum blues). The baby blues is a common condition that many new moms have soon after giving birth. They may feel sad, moody, or anxious. But these feelings go away in about 2 weeks. If you still have these feelings after 2 weeks, or if you start to feel worse at any time, call your healthcare provider. They will evaluate you to see if you have postpartum depression. It's important to get treated right away.   What causes postpartum depression?  Postpartum depression is likely caused by a mix of physical and emotional factors. These include:   Changing hormone levels. Right after you have a baby, your levels of estrogen and progesterone drop. This sudden change may set off depression. Your thyroid hormone levels may also drop at the same time.  Depression. You may be at greater risk if you have a history of depression, or if you are currently being treated for depression.  Extreme tiredness (fatigue). It can take a few weeks to physically recover from giving birth. In addition, most new moms don't get the  rest or sleep they need.  Lifestyle issues. Other stressors may also play a role. These can include money, health, or relationship problems, or not getting support from friends or family.  Symptoms of postpartum depression  Symptoms may vary. But they are very intense and may include:  Feeling very tired, with no energy (fatigue)  Crying often or for no reason  Feeling very   sad, anxious, or overwhelmed  Being moody  Feeling that you can't take care of your baby  Sleeping too much, or not sleeping  Having trouble eating  Having trouble making decisions and focusing  Not being able to do your normal daily tasks  Not being interested in your baby  Not wanting to be around family or friends  Wanting to hurt yourself or your baby  Diagnosing postpartum depression  Early diagnosis and treatment are important. Only a healthcare provider can diagnose you. Call your provider if you have symptoms of depression, or you have trouble doing your normal activities, for longer than 2 weeks.   Your provider will talk with you and ask for a health history. You may be asked to fill out a form that helps to identify depression. You may also have some blood tests done. These are used to find out if your symptoms may be caused by a thyroid disorder or other health condition.   If not treated, postpartum depression can have serious effects for you and your baby. Women with this condition who are not treated:   Are less likely to breastfeed their baby  May not bond with their baby  Are less likely to take good care of their baby  Are more likely to have problems with their partner  May have thoughts of harming themselves  May have thoughts of harming their baby  Are you at risk for postpartum depression?  You are more at risk for this condition if you have:  A history of depression (including postpartum depression in a past pregnancy)  A family member with depression  A history of alcohol or drug abuse  Had a difficult childbirth, such as  a premature birth  A baby with health problems or special needs  No emotional support from your partner, family, or friends  Other stresses in your life, such as money or relationship problems  Mixed feelings about this pregnancy, such as with an unplanned pregnancy  Treatment for postpartum depression  Treatment is available. Your healthcare provider can help you decide on the best treatment option for you. They may advise:   Medicine. Antidepressants are the main type of medicine for postpartum depression. These medicines affect the brain chemicals that help control moods. Let your provider know if you are breastfeeding. Most antidepressants are considered safe to use when breastfeeding.  Talk therapy (counseling or psychotherapy) . This treatment involves talking with a mental health provider about your feelings and issues that might add to the depression. Therapy may be done in private sessions with just you and the provider. Or it may be done in group sessions with other new parents. You and your mental health provider may talk about ways you can be successful with:  Your new role and changing relationships with others  Balancing your well-being while caring for your baby  Medicine and talk therapy can be used alone or together.  Joining a support group for moms with this condition may also help you cope. Check online for support groups in your area, or ask your healthcare provider for suggestions.   Managing postpartum depression  In addition to seeing your healthcare provider and getting treatment, it's important to take care of yourself. Tell your family and friends how you are feeling and what kind of help or support you need. Make sure to also:   Get enough sleep  Eat healthy foods  Rest when your baby takes a nap  Get some light exercise    Don't try to do chores  Ask for and accept any help with meals, shopping, and laundry  To learn more  Find more information at:  [employer]  www.postpartum.net/  Office on Women's Health www.womenshealth.gov/  [employer] last reviewed this educational content on 02/06/2019     2000-2022 [employer]. All rights reserved. This information is not intended as a substitute for professional medical care. Always follow your healthcare professional's instructions.

## 2022-02-13 NOTE — Progress Notes (Signed)
OB PROGRESS NOTE    Date Time: 02/13/22 8:27 AM  Patient Name: [name],[name]    Assumed care from T. Sallyanne Havers, CNM    Subjective:   [name] continues to cope very well with the labor process.  She experiences some discomfort with uterine contractions and desires pain management with Nitrous Oxide.  She is accompanied by her spouse and Doula who are both providing excellent support.  She consents to a cervical exam at this time.      Objective:     Vitals:    02/13/22 0725   BP: 114/69   Pulse: (!) 111   Resp: 18   Temp:        Cervical Exam: Dilation: 6, Effacement (%): 80, Station: -1, cephalic    Membrane Status: Intact,      FHT: Baseline Rate: 130 BPM, Variability: Moderate, Pattern: Accelerations    Contraction Frequency: 2-3     Medication: Pitocin @ 68mu/min    Pain meds:  none at this time      GBS: Positive treated x 2 doses of Ampicillin     Assessment:   31 y.o. G2P0 at [redacted]w[redacted]d  1. IOL for post term pregnancy  2. Membranes intact, GBS positive on antibiotics  3. FHR Category: Category I   4. VSS    Plan:   Disc R/B of Nitrous Oxide for labor pain management, consent signed  Initiate Nitrous Oxide per patient request  Continue Pitocin infusion per slow unit protocol  Consider AROM following initiation of Nitrous Oxide  Continue to balance periods of rest with exercises to promote optimal fetal descent and labor progression   Continue collaborative management of care with on call MD, Dr. Nickie Retort   Anticipate 2nd stage of labor   Reassess in 4 hrs/prn.    Plan of care discussed with patient and husband, patient in agreement, questions answered and addressed.     Signed by: Maia Petties, CNM 02/13/2022        OB

## 2022-02-13 NOTE — Progress Notes (Signed)
[name] continues to cope well with labor process.  She has been balancing ambulation with periods of rest.  She is consenting a AROM however wishes to discuss the option of an epidural as she states that she is "very tired" and did not receive adequate relief with the Nitrous Oxide.  Her spouse and doula continue to provide excellent support.      FHR 130 bpm, moderate variability, accelerations present, occasional late FHR decelerations but occurs with less than 50% of contractions.  Cat II tracing  Toco:  q 1-4 min, moderate on palpation  SVE: unchanged, AROM scant clear fluid  Pitocin infusing at 8 mu/min    Disc R/B/A of pain management options to include hydrotherapy (shower) and position changes.  Pt verbalized desire to proceed with epidural for pain management.  RN to notify anesthesia.     Disc assessment and plan of care with on call MD, Dr. Nickie Retort.

## 2022-02-13 NOTE — Progress Notes (Signed)
Pt desires Nitrous Oxide for pain relief

## 2022-02-13 NOTE — Anesthesia Preprocedure Evaluation (Addendum)
Anesthesia Evaluation    AIRWAY    Mallampati: IV    TM distance: >3 FB  Neck ROM: full  Mouth Opening:limited   CARDIOVASCULAR    cardiovascular exam normal       DENTAL                   PULMONARY    pulmonary exam normal     OTHER FINDINGS                                      Relevant Problems   No relevant active problems               Anesthesia Plan    ASA 2     epidural               (Discussed health history and anesthesia plan.)      Detailed anesthesia plan: epidural        Post op pain management: epidural    informed consent obtained      pertinent labs reviewed             Signed by: Renaldo Fiddler, MD 02/13/22 10:26 AM

## 2022-02-13 NOTE — Progress Notes (Addendum)
OB PROGRESS NOTE    Date Time: 02/13/22 5:14 PM  Patient Name: [name], [name]      Subjective:   [name] continues to cope well with the labor process.  She remains overall comfortable with the epidural however she does report some discomfort on the left hip.  We have worked through several exercises to include hands and knees, shaking the apple tree, side lying release on the right side, baby did not tolerate performing side lying release on the left.  She consented to a cervical exam.     Objective:     Vitals:    02/13/22 1657   BP: 133/90   Pulse: 99   Resp:    Temp:    SpO2:        Cervical Exam: Dilation: 6, Effacement (%): 80, Station: -1, cephalic, ROT, asynclitic- no cervical change since 0800 on 02/13/2022    Membrane Status: Artificial, Color: Clear    FHT: Baseline Rate: 130 BPM, Variability: Moderate, Pattern: Variable decelerations    Contraction Frequency: 2-5     Medication: Pitocin @ 8 mu/min   started at 0338 on 02/13/2022    Pain meds: Epidural in place     GBS: Positive treated with Ampicillin x 4 doses    Assessment:   31 y.o. G2P0 at [redacted]w[redacted]d  1. spontaneous labor  2. ROM x 7 hours, Clear fluid, GBS positive on antibiotics  3. FHR Category: Category II  4. VSS    Plan:   Advised position change to attempt to correct asynclitism and increase Pitocin as FHR would tolerate, pt requested some time to discuss with her family.  After approximately 20 minutes CNM was called back to the room and the patient and her family have elected to proceed with a cesarean section.    On call MD, Dr. Nickie Retort notified and informed of patient's decision to proceed with cesarean section.       Plan of care discussed with patient and family, patient in agreement, questions answered and addressed.     Signed by: Maia Petties, CNM 02/13/2022        OB

## 2022-02-14 LAB — CBC AND DIFFERENTIAL
Absolute NRBC: 0 10*3/uL (ref 0.00–0.00)
Basophils Absolute Automated: 0.02 10*3/uL (ref 0.00–0.08)
Basophils Automated: 0.1 %
Eosinophils Absolute Automated: 0 10*3/uL (ref 0.00–0.44)
Eosinophils Automated: 0 %
Hematocrit: 34 % — ABNORMAL LOW (ref 34.7–43.7)
Hgb: 11.7 g/dL (ref 11.4–14.8)
Immature Granulocytes Absolute: 0.07 10*3/uL (ref 0.00–0.07)
Immature Granulocytes: 0.5 %
Instrument Absolute Neutrophil Count: 11.46 10*3/uL — ABNORMAL HIGH (ref 1.10–6.33)
Lymphocytes Absolute Automated: 1.14 10*3/uL (ref 0.42–3.22)
Lymphocytes Automated: 8.2 %
MCH: 32.8 pg (ref 25.1–33.5)
MCHC: 34.4 g/dL (ref 31.5–35.8)
MCV: 95.2 fL (ref 78.0–96.0)
MPV: 11 fL (ref 8.9–12.5)
Monocytes Absolute Automated: 1.2 10*3/uL — ABNORMAL HIGH (ref 0.21–0.85)
Monocytes: 8.6 %
Neutrophils Absolute: 11.46 10*3/uL — ABNORMAL HIGH (ref 1.10–6.33)
Neutrophils: 82.6 %
Nucleated RBC: 0 /100 WBC (ref 0.0–0.0)
Platelets: 142 10*3/uL (ref 142–346)
RBC: 3.57 10*6/uL — ABNORMAL LOW (ref 3.90–5.10)
RDW: 14 % (ref 11–15)
WBC: 13.89 10*3/uL — ABNORMAL HIGH (ref 3.10–9.50)

## 2022-02-14 NOTE — Plan of Care (Signed)
Problem: Safety  Goal: Patient will be free from injury during hospitalization  02/14/2022 1201 by Cline Crock, RN  Outcome: Progressing  02/14/2022 1201 by Cline Crock, RN  Outcome: Progressing  Goal: Patient will be free from infection during hospitalization  02/14/2022 1201 by Cline Crock, RN  Outcome: Progressing  02/14/2022 1201 by Cline Crock, RN  Outcome: Progressing     Problem: Pain  Goal: Pain at adequate level as identified by patient  02/14/2022 1201 by Cline Crock, RN  Outcome: Progressing  02/14/2022 1201 by Cline Crock, RN  Outcome: Progressing     Problem: Side Effects from Pain Analgesia  Goal: Patient will experience minimal side effects of analgesic therapy  02/14/2022 1201 by Cline Crock, RN  Outcome: Progressing  02/14/2022 1201 by Cline Crock, RN  Outcome: Progressing     Problem: Discharge Barriers  Goal: Patient will be discharged home or other facility with appropriate resources  02/14/2022 1201 by Cline Crock, RN  Outcome: Progressing  02/14/2022 1201 by Cline Crock, RN  Outcome: Progressing     Problem: Psychosocial and Spiritual Needs  Goal: Demonstrates ability to cope with hospitalization/illness  02/14/2022 1201 by Cline Crock, RN  Outcome: Progressing  02/14/2022 1201 by Cline Crock, RN  Outcome: Progressing     Problem: Vaginal/Cesarean Delivery  Goal: Postpartum management of pain/discomfort  Outcome: Progressing  Flowsheets (Taken 02/14/2022 1201)  Postpartum management of pain/discomfort:   Assess pain using a consistent, developmental/age appropriate pain scale   Assess pain level before and following intervention   Include patient/patient care companion in decisions related to pain management   Monitor for post anesthesia issues related to pain management   Offer non-pharmacologic pain management interventions  Goal: Breasts are soft with nipple integrity intact  Outcome: Progressing  Flowsheets (Taken 02/14/2022 1201)  Breasts are  soft with nipple integrity intact:   Perform breast/nipple assessment   Assess and manage engorgement   Breastfeed and/or pump breasts at least 8-12 times within 24 hours   Ensure proper positioning and latch   Provide pharmacologic and non-pharmacologic interventions as needed  Goal: Gastrointestinal/Urinary management  Outcome: Progressing  Flowsheets (Taken 02/14/2022 1201)  Gastrointestinal/Urinary management:   Adequate bladder emptying per phase of care   Use CAUTI prevention techniques   Monitor intake and output per orders  Note: Foley catheter removed  Goal: Uterine management  Outcome: Progressing  Flowsheets (Taken 02/14/2022 1201)  Uterine management: Assess fundus and notify LIP if not firm, midline, or at or below the umbilicus, or if abdomen is abnormally distended  Goal: Perineum will be clean, dry, and intact and without discharge or hematoma  Outcome: Progressing  Flowsheets (Taken 02/14/2022 1201)  Perineum will be clean, dry, and intact and without discharge or hematoma:   Provide pericare   Assess for hemorrhoids and provide interventions  Goal: Incision will be clean, dry, and intact and without discharge or hematoma  Outcome: Progressing  Flowsheets (Taken 02/14/2022 1201)  Incision will be clean, dry and intact and without discharge or hematoma:   Assess abdominal dressing and incision   Monitor incision for signs of infections   Assess incision for bleeding/hematoma   Assess incision for evidence of healing  Goal: VTE Prevention  Outcome: Progressing  Flowsheets (Taken 02/14/2022 1201)  VTE prevention:   Patient ambulating with assistance   Patient ambulating independently   Assess skin color, turgor, peripheral pulses, perfusion, and presence of edema, e.g. capillary refill  Note: SCD removed. Pt ambulatory.  Goal: Evidence of  positive mother-baby interactions  Outcome: Progressing  Flowsheets (Taken 02/14/2022 1201)  Evidence of positive mother-baby interactions:   Include patient/patient care  companion in decisions related to care   Initiate skin to skin   Initiate safety and falls prevention interventions   Assess emotional status and coping mechanisms   Encourage rooming in and infant feeding on demand   Ensure parent/caregiver provides infant care   Assess parent/caregiver engagement and awareness of infant cues/behavior     Problem: Moderate/High Fall Risk Score >5  Goal: Patient will remain free of falls  02/14/2022 1201 by Cline Crock, RN  Outcome: Progressing  02/14/2022 1201 by Cline Crock, RN  Outcome: Progressing

## 2022-02-14 NOTE — Lactation Note (Signed)
02/14/22 1200   Lactation Consultation   Visit Type  Initial Assessment   Maternal Information   Breastfeeding Status Yes   Previous Breastfeeding Experience No   Taking meds incompatible with breastfeeding N  (none per MAR)   Storing milk  N   Pumping Experience N   Pumping this hospitalization N   Dumping Milk N   Breast Assessment   Left Breast Small;Medium   Left Nipple Evert   Right Breast Small;Medium   Right Nipple Evert;Other (Comment)  (see note)   Equipment   Equipment Personal use pump        02/14/22 1200   Lactation Consultation   Visit Type  Initial Assessment   Infant Assessment   Voids in Past 24 hours   (18 hours old at time of visit)   Feeding Assessment   Infant Level of Arousal Awake   Feeding Positions Used Football   Alignment Straight line at breast level;Nose to nipple;Chin touching breast   Areolar Grasp Lips flanged;Asymmetric latch   Areolar Compression Rhythmic;Long jaw movements   Suck/Swallow Visual swallows   OTHER   Oral Assessment (WDL) WDL   Feeding Information   Feeding Tolerance Tolerates well     Initial visit with mother to assist with latch. Father at bedside and engaged. With minimal assistance mother able to latch infant on R breast in FB hold. Infant with long jaw drops x 15 min .Infant transferred to [facility] and mother was independently able to latch baby. Colostrum from R nipple is blood tinged. Mother states she used her PUP at home and had the settings up too high.  Colostrum from L nipple is white/clear. Mother encouraged to be sure baby is latching deeply and pain free.    Signs of a deep latch reviewed with mom. Baby's body pressed firmly against your breast and nose, chin and cheeks touching breast, both lips flanged out, you will see more areola above baby's upper lip than below (asymetrical latch) and you should feel "tugging" not "pinching" when baby is nursing.  Watch for long jaw drop and movement up to ear which indicates swallowing.Discussed with mother the  difference between active suckles that are transferring milk vs comfort suckles and that active suckle time should be 15-20 minutes on each breast.    Discussed typical newborn feeding behavior & patterns, along with observing for early infant hunger cues.  Benefits of SSC discussed. Reviewed number of voids and stools through day 5 and beyond. Discussed that stool should be yellow seedy by day 5.pee/poop/feeding log given to mother.      Discussed optimal positioning so that infant feels well supported and in good position for latch.    Discussed community resources     RN updated

## 2022-02-14 NOTE — Progress Notes (Signed)
Mom and baby transferred from L&D to [facility] room  349.  ID bands on baby's ankles, match mom's.  Verified with L&D RN.  Correct pediatrician verified with mom.

## 2022-02-14 NOTE — Progress Notes (Signed)
[place] [facility] POSTPARTUM ROUNDING NOTE    Subjective:  Delivery Type: Cesarean for arrest of dilation   Birth Date: [birth year]/20[birth year]    Delivery Time: 6:19 PM    Minimal bleeding  Breastfeeding well  Pain managed  Passing gas  Foley catheter in place, draining clear, yellow urine  Baby doing well, followed by peds    Objective:  Vital Signs Stable:   Vitals:    02/14/22 0533   BP: 111/75   Pulse: 91   Resp: 18   Temp: 97.5 F (36.4 C)   SpO2: 98%     Breast soft, non tender  Fundus firm, non tender  Incision CDI  Extremities WNL  Abdomen soft  Lochia: rubra, small     Labs:  Results       Procedure Component Value Units Date/Time    CBC and differential [[id number]]  (Abnormal) Collected: 02/14/22 0559    Specimen: Blood Updated: 02/14/22 0608     WBC 13.89 x10 3/uL      Hgb 11.7 g/dL      Hematocrit 40.9 %      Platelets 142 x10 3/uL      RBC 3.57 x10 6/uL      MCV 95.2 fL      MCH 32.8 pg      MCHC 34.4 g/dL      RDW 14 %      MPV 11.0 fL      Instrument Absolute Neutrophil Count 11.46 x10 3/uL      Neutrophils 82.6 %      Lymphocytes Automated 8.2 %      Monocytes 8.6 %      Eosinophils Automated 0.0 %      Basophils Automated 0.1 %      Immature Granulocytes 0.5 %      Nucleated RBC 0.0 /100 WBC      Neutrophils Absolute 11.46 x10 3/uL      Lymphocytes Absolute Automated 1.14 x10 3/uL      Monocytes Absolute Automated 1.20 x10 3/uL      Eosinophils Absolute Automated 0.00 x10 3/uL      Basophils Absolute Automated 0.02 x10 3/uL      Immature Granulocytes Absolute 0.07 x10 3/uL      Absolute NRBC 0.00 x10 3/uL     Urine - Chlamydia/Neisseria/Trich PCR [[id number]] Collected: 02/12/22 1949    Specimen: Urine, First Catch Updated: 02/13/22 1121     Chlamydia DNA by PCR Negative     Neisseria gonorrhoeae by PCR Negative     Trichomonas vaginalis, DNA Negative            Assessment/Plan:  Stable - Postpartum Day 1  Encourage ambulation  D/C foley later this morning  Encourage lactation class and discharge class  Routine care  Plan  for discharge on POD#3    Signed by: Maia Petties, CNM 02/14/2022

## 2022-02-14 NOTE — Progress Notes (Signed)
Patient is post partum day 1.  She had an epidural for her delivery.  Her sensory-motor function is back to baseline. She has been up ambulating.  She is not complaining of headache. No apparent anesthetic complications.      Pain controlled with Duramorph.

## 2022-02-15 ENCOUNTER — Encounter: Payer: Self-pay | Admitting: Student in an Organized Health Care Education/Training Program

## 2022-02-15 NOTE — Discharge Summary (Signed)
[facility]  Obstetrical Discharge Form      Admission Diagnosis: Intrauterine Pregnancy at [redacted]w[redacted]d    Common OB Conditions:  Common OB Conditions: Group B Strep colonization    Post Term/Post Dates Pregnancy      Intrapartum/Postpartum Conditions:  none    Date of Delivery: 02/13/2022    Time of Delivery: 6:19 PM      Delivered By: Luanne Bras     Delivery Type: Cesarean   Tubal Ligation: n/a    Anesthesia: Epidural    Baby: Liveborn Female ; Birth weight: 9 lb 4.9 oz (4220 g) ; Apgar 1 minute: 8  Apgar 5 minute: 9 ;                                                       Feeding Type: Breast milk     Delivery complications: none    Episiotomy: None   Laceration Type:  None     Prenatal Labs:   Lab Results   Component Value Date    ABORH O POS 02/12/2022    RUBELLAABIGG Immune 06/22/2021    HEPBSAG Negative 06/22/2021    GBS Positive 01/05/2022       Hospital Course : [name] is a 31 y.o. with an IUP at [redacted]w[redacted]d. She was admitted to the hospital for induction of labor. Her antepartum course was complicated by post term pregnancy and GBS positive  .  Patient delivered via C-section with labor delivery. Her postpartum course was uncomplicated. She was discharged home in a stable good condition.    Discharge Date: 02/15/22     Plan:     [place] Instructions:   The patient has been given a discharge instruction sheet which includes precautions for activity level, dietary precautions, expectations, potential complications that require contact with the office and discharge prescriptions. These instructions were reviewed with the patient. All questions were answered.    Follow-up appointment in 6 weeks if vaginal delivery and 2 weeks if c/section.

## 2022-02-15 NOTE — Progress Notes (Signed)
[place] [facility] POSTPARTUM ROUNDING NOTE    Subjective:  Delivery Type: Cesarean for arrest of dilation   Birth Date: 02/13/2022    Delivery Time: 6:19 PM    Minimal bleeding  Breastfeeding well  Pain managed  Passing gas  Tolerating a regular PO diet   Voiding without difficulty  Baby doing well, followed by peds    Objective:  Vital Signs Stable:   Vitals:    02/15/22 0738   BP: 105/73   Pulse: (!) 112   Resp: 18   Temp: 98.8 F (37.1 C)   SpO2: 98%     Breast soft, non tender  Fundus firm, non tender  Incision CDI  Extremities WNL  Abdomen soft    Labs:  Results       ** No results found for the last 24 hours. **            Assessment/Plan:  Stable - Postpartum Day 2  Encourage ambulation  Encourage lactation class and discharge class  Routine care  Desires discharge home today  Follow up with [facility] in 2 weeks for incision check then follow up for remaining pp care with [facility]       Signed by: Maia Petties, CNM 02/15/2022

## 2022-02-15 NOTE — Plan of Care (Signed)
Problem: Safety  Goal: Patient will be free from injury during hospitalization  Outcome: Completed  Goal: Patient will be free from infection during hospitalization  Outcome: Completed     Problem: Pain  Goal: Pain at adequate level as identified by patient  Outcome: Completed     Problem: Side Effects from Pain Analgesia  Goal: Patient will experience minimal side effects of analgesic therapy  Outcome: Completed     Problem: Discharge Barriers  Goal: Patient will be discharged home or other facility with appropriate resources  Outcome: Completed     Problem: Psychosocial and Spiritual Needs  Goal: Demonstrates ability to cope with hospitalization/illness  Outcome: Completed     Problem: Vaginal/Cesarean Delivery  Goal: Postpartum management of pain/discomfort  Outcome: Completed  Goal: Breasts are soft with nipple integrity intact  Outcome: Completed  Goal: Gastrointestinal/Urinary management  Outcome: Completed  Goal: Uterine management  Outcome: Completed  Goal: Perineum will be clean, dry, and intact and without discharge or hematoma  Outcome: Completed  Goal: Incision will be clean, dry, and intact and without discharge or hematoma  Outcome: Completed  Goal: VTE Prevention  Outcome: Completed  Goal: Evidence of positive mother-baby interactions  Outcome: Completed     Problem: Moderate/High Fall Risk Score >5  Goal: Patient will remain free of falls  Outcome: Completed

## 2022-02-15 NOTE — Progress Notes (Signed)
Discharge order entered.  Vaccine screening complete. Education QR code for mother baby care information discussed.   [place] instructions given.  Questions answered.  Mom Thatcher'd

## 2022-02-15 NOTE — Plan of Care (Signed)
Problem: Vaginal/Cesarean Delivery  Goal: Postpartum management of pain/discomfort  Outcome: Progressing  Flowsheets (Taken 02/14/2022 1201 by Cline Crock, RN)  Postpartum management of pain/discomfort:   Assess pain using a consistent, developmental/age appropriate pain scale   Assess pain level before and following intervention   Include patient/patient care companion in decisions related to pain management   Monitor for post anesthesia issues related to pain management   Offer non-pharmacologic pain management interventions  Note: Made pt aware stronger pain medication is available if needed  Goal: Breasts are soft with nipple integrity intact  Outcome: Progressing  Flowsheets (Taken 02/14/2022 1201 by Cline Crock, RN)  Breasts are soft with nipple integrity intact:   Perform breast/nipple assessment   Assess and manage engorgement   Breastfeed and/or pump breasts at least 8-12 times within 24 hours   Ensure proper positioning and latch   Provide pharmacologic and non-pharmacologic interventions as needed  Goal: Evidence of positive mother-baby interactions  Outcome: Progressing  Flowsheets (Taken 02/14/2022 1201 by Cline Crock, RN)  Evidence of positive mother-baby interactions:   Include patient/patient care companion in decisions related to care   Initiate skin to skin   Initiate safety and falls prevention interventions   Assess emotional status and coping mechanisms   Encourage rooming in and infant feeding on demand   Ensure parent/caregiver provides infant care   Assess parent/caregiver engagement and awareness of infant cues/behavior

## 2022-02-16 NOTE — UM Notes (Addendum)
Requesting final auth to be faxed to [facility] [phone] and additional  information can be requested by calling  UR Main Line: [phone]    Patient Name: [name]       DOB: May 16, 1991  MRN: [id number]      Auth Number :  [id number]    Discharge Date -  02/15/2022  3:17 PM    ADMIT DATE AND TIME: 02/12/2022  4:24 PM    02/13/22 1927  ADMIT TO INPATIENT  Once        Diagnosis: Post-Dates Pregnancy   Level of Care: Intermediate Care   Patient Class: Inpatient    References:    [facility] Bed Placement Criteria    [facility] Bed Placement Criteria    [facility] Bed Placement Criteria    [facility] Bed Placement Criteria    [facility] Bed Placement Criteria    New Service Definitions Feb 2023   Question Answer Comment   Admitting Physician [facility], DIANA    Service: OB Post Partum    Estimated Length of Stay > or = to 2 midnights    Tentative Discharge Plan? Home or Self Care    Does patient need telemetry? 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  PSH:  has a past surgical history that includes BIOPSY, BREAST, TUMOR EXCISION, ULTRASOUND NEEDLE LOCALIZATION (Left, 02/24/2017); Breast biopsy; and CESAREAN SECTION (N/A, 02/13/2022).    [name] is a 31 y.o. G2P0 female at 66.[redacted] weeks gestation, Estimated Date of Delivery: 01/30/2022.  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    She was admitted for induction of labor due to post dates.  Significant factors related to the current pregnancy include: GBS positive culture.  Patient reports contractions since this am, mild and every 10 minutes .  She denies  vaginal bleeding and leakage of fluid.    BP 105/73   Pulse (!) 112   Temp 98.8 F (37.1 C) (Oral)   Resp 18   Ht 1.676 m (5\' 6" )   Wt 72.5 kg (159 lb 13.3 oz)   LMP 04/26/2021   SpO2 98%   Breastfeeding Yes   BMI 25.80 kg/m     Last recorded pain score:  Pain Scale Used: Numeric Scale (0-10)  Pain Score: 2-mild pain     DIAGNOSIS:     ICD-10-CM    1. Post-dates pregnancy  O48.0         Delivery Report  Admission Diagnosis: Intrauterine Pregnancy at [redacted]w[redacted]d  Common OB Conditions:  Common OB Conditions: Group B Strep colonization    Post Term/Post Dates Pregnancy    Intrapartum/Postpartum Conditions: none  Date of Delivery: 02/13/2022    Time of Delivery: 6:19 PM    Delivered By: Luanne Bras   Delivery Type: Cesarean   Tubal Ligation: n/a  Anesthesia: Epidural  Baby: Liveborn Female ; Birth weight: 9 lb 4.9 oz (4220 g) ;  Apgar 1 minute: 8  Apgar 5 minute: 9 ; Feeding Type: Breast milk      OB 02/15/2022  Assessment/Plan:  Stable - Postpartum Day 2  Encourage ambulation  Encourage lactation class and discharge class  Routine care  Desires discharge home today  Follow up with [facility] in 2 weeks for incision check then follow up for remaining pp care with [facility] Course : [name] is a 31 y.o. with an IUP at [redacted]w[redacted]d. She was admitted to the hospital for induction of labor. Her antepartum course was complicated by post term pregnancy and GBS positive  .  Patient delivered via C-section with labor delivery. Her postpartum course was uncomplicated. She was discharged home in a stable good condition.     Discharge Date: 02/15/22     This clinical review is based on compiled documentation provided by the treatment team within the patient's medical record.      UTILIZATION REVIEW CONTACT :   Ronalee Belts, MSN, RN, ACM  Utilization Review Case Manager ll   Phone: [phone] (vm only)  Main Line: [phone]  Fax Line [phone]  Cell [phone]  (8a-5p)  [email or web address]    Tax ID: [id number]  [facility]   (IAH) [facility]   (IFH) [place] [facility]   [facility]) [facility]   [facility]) [place] [facility]  [facility])   [address]  [place], [place] [address] [address]  [address] [place], [place] [address] [address]  [place], [place] [address] [phone] [facility].  [place], [place] [address] [address]  [place], [place] [address]   NPI: [id number] NPI: [id number] NPI: [id number] NPI: [id number] NPI: [id number]

## 2022-03-31 ENCOUNTER — Other Ambulatory Visit (INDEPENDENT_AMBULATORY_CARE_PROVIDER_SITE_OTHER): Payer: Self-pay | Admitting: Family Medicine

## 2022-03-31 DIAGNOSIS — D242 Benign neoplasm of left breast: Secondary | ICD-10-CM

## 2022-04-02 ENCOUNTER — Encounter (INDEPENDENT_AMBULATORY_CARE_PROVIDER_SITE_OTHER): Payer: Self-pay | Admitting: Family Medicine

## 2022-04-03 ENCOUNTER — Telehealth (INDEPENDENT_AMBULATORY_CARE_PROVIDER_SITE_OTHER): Payer: Self-pay | Admitting: Family Medicine

## 2022-04-03 ENCOUNTER — Encounter (INDEPENDENT_AMBULATORY_CARE_PROVIDER_SITE_OTHER): Payer: Self-pay

## 2022-04-03 NOTE — Telephone Encounter (Signed)
-----   Message from Winfield Rast, MD sent at 04/03/2022  6:53 AM EDT -----  Regarding: fax breast US order  Hi, would someone please fax over the breast ultrasound order in chart to patient's preferred location?   [facility] in [place] is what patient had mentioned for the imaging location, thank you!

## 2022-04-03 NOTE — Telephone Encounter (Signed)
RN reached out to patient,Pt did not have the fax number for the place she was going.RN advised [facility] should be able to see the order in the chart.Pt will to schedule and if at the time,needs order to be faxed,will send a message along with the fax number.[facility] Parents

## 2022-04-05 ENCOUNTER — Other Ambulatory Visit (INDEPENDENT_AMBULATORY_CARE_PROVIDER_SITE_OTHER): Payer: Self-pay | Admitting: Family Medicine

## 2022-04-05 DIAGNOSIS — N6320 Unspecified lump in the left breast, unspecified quadrant: Secondary | ICD-10-CM

## 2022-04-06 ENCOUNTER — Encounter (INDEPENDENT_AMBULATORY_CARE_PROVIDER_SITE_OTHER): Payer: Self-pay | Admitting: Family Medicine

## 2022-04-14 ENCOUNTER — Encounter (INDEPENDENT_AMBULATORY_CARE_PROVIDER_SITE_OTHER): Payer: Self-pay

## 2022-04-22 ENCOUNTER — Ambulatory Visit
Admission: RE | Admit: 2022-04-22 | Discharge: 2022-04-22 | Disposition: A | Discharge status: Home / Self Care | Payer: No Typology Code available for payment source | Source: Ambulatory Visit | Attending: Advanced Practice Midwife | Admitting: Advanced Practice Midwife

## 2022-04-22 DIAGNOSIS — M6200 Separation of muscle (nontraumatic), unspecified site: Secondary | ICD-10-CM | POA: Insufficient documentation

## 2022-04-22 DIAGNOSIS — K5900 Constipation, unspecified: Secondary | ICD-10-CM | POA: Insufficient documentation

## 2022-04-22 DIAGNOSIS — N3941 Urge incontinence: Secondary | ICD-10-CM | POA: Insufficient documentation

## 2022-04-22 DIAGNOSIS — N9411 Superficial (introital) dyspareunia: Secondary | ICD-10-CM | POA: Insufficient documentation

## 2022-04-23 ENCOUNTER — Other Ambulatory Visit: Payer: Self-pay | Admitting: Family Medicine

## 2022-04-23 DIAGNOSIS — N63 Unspecified lump in unspecified breast: Secondary | ICD-10-CM

## 2022-04-26 NOTE — Addendum Note (Signed)
Addended by: Winfield Rast on: 04/26/2022 12:55 PM     Modules accepted: Orders

## 2022-04-27 ENCOUNTER — Encounter (INDEPENDENT_AMBULATORY_CARE_PROVIDER_SITE_OTHER): Payer: Self-pay | Admitting: Family Medicine

## 2022-04-28 ENCOUNTER — Other Ambulatory Visit (INDEPENDENT_AMBULATORY_CARE_PROVIDER_SITE_OTHER): Payer: Self-pay | Admitting: Family Medicine

## 2022-04-28 DIAGNOSIS — N6320 Unspecified lump in the left breast, unspecified quadrant: Secondary | ICD-10-CM

## 2022-05-08 ENCOUNTER — Ambulatory Visit
Admission: RE | Admit: 2022-05-08 | Discharge: 2022-05-08 | Disposition: A | Discharge status: Home / Self Care | Payer: No Typology Code available for payment source | Source: Ambulatory Visit | Attending: Advanced Practice Midwife | Admitting: Advanced Practice Midwife

## 2022-05-08 DIAGNOSIS — M6200 Separation of muscle (nontraumatic), unspecified site: Secondary | ICD-10-CM | POA: Insufficient documentation

## 2022-05-08 DIAGNOSIS — K5900 Constipation, unspecified: Secondary | ICD-10-CM | POA: Insufficient documentation

## 2022-05-08 DIAGNOSIS — N9411 Superficial (introital) dyspareunia: Secondary | ICD-10-CM | POA: Insufficient documentation

## 2022-05-08 DIAGNOSIS — N3941 Urge incontinence: Secondary | ICD-10-CM | POA: Insufficient documentation

## 2022-05-14 NOTE — Progress Notes (Signed)
[facility]  [address]  [address]  [place], [place], [address]  [phone]  06/02/2022       Patient:   [name]                                                  CSN:        [id number]                                          DOB:       21-Nov-1990                                                    MRN:        [id number]       SUBJECTIVE     History was provided by the patient      HPI: Patient is a 31 y.o. female who comes in to establish care   Previous PCP was Dr. Winfield Rast, MD in [place]   LOV was Nov 2022.    Reason for transferring: lives in [place], this is closer for her.      New concerns    Breast issues-  Patient requests referral to breast clinic. She had abnormal mammogram as described below. She wants specialist opinion. She is currently breastfeeding.   04/23/2022 had mammogram -  "1. Bilateral previously biopsied masses of the right 6:00 position and the left 7:00 position appear increased in size. This is probably related to  breast-feeding. Recommend six-month follow-up.  2. Distortion of the left breast probably related to prior surgery. No  suspicious sonographic correlate. Recommend six-month follow-up."    04/23/2022 breast US- "1. Bilateral previously biopsied masses of the right 6:00 position and the  left 7:00 position appear increased in size. This is probably related to  breast-feeding. Recommend six-month follow-up.  2. Distortion of the left breast probably related to prior surgery. No  suspicious sonographic correlate. Recommend six-month follow-up.     BIRADS CATEGORY 03-PROBABLY BENIGN FINDING-  Short Interval Follow-up Suggested  (3A)"    Post pregnancy pelvic floor weakness/diastasis recti - was doing pelvic floor PT in [facility], needs another order to continue with this.     Patient reports she has left knee pain -  started after delivery of her baby on 02/13/2022.   Patient denies any swelling, warmth or instability to the knee.   She reports the pain is intermittent. She reports knee is stiff.   Patient reports the pain is aching.   Patient has not tried any medications for this. She was doing some exercises in PT but notes she needs an order to eval further in PT.     She delivered her baby on 02/13/2022.     Social hx:   Tobacco use: Non smoker    ETOH: None    Drug use: None       Health Maintenance:   Colonoscopy: start at age 48   Mammogram: had in 04/2022.   Pap smear: Has appt with gynecology scheduled in  07/2022 to update.     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    Group B streptococcal infection in pregnancy      Family History   Problem Relation Age of Onset    No known problems Mother     Lung cancer Father     Cancer Father         Lung Cancer    No known problems Sister     No known problems Brother     No known problems Sister     No known problems Sister     No known problems Sister     Breast cancer Paternal Aunt 28    Colon cancer Paternal Aunt     Diabetes Maternal Grandfather     Stroke Maternal Grandfather     Stroke Paternal Grandmother     Hypertension Neg Hx     Ovarian cancer Neg Hx      Social History     Social History Narrative    Not on file     No Known Allergies      Review of Systems: See HPI     PHYSICAL EXAM     BP 110/67 (BP Site: Left arm, Patient Position: Sitting, Cuff Size: Medium)   Pulse 100   Temp 98 F (36.7 C) (Temporal)   Resp 17   Ht 1.676 m (5\' 6" )   Wt 62.4 kg (137 lb 8 oz)   SpO2 100%   Breastfeeding Yes   BMI 22.19 kg/m       Physical Exam  Constitutional:       General: She is not in acute distress.     Appearance: She is not ill-appearing or toxic-appearing.   HENT:      Head: Normocephalic.   Cardiovascular:      Rate and Rhythm: Normal rate and regular rhythm.   Pulmonary:      Effort: Pulmonary effort is normal. No tachypnea,  prolonged expiration or respiratory distress.      Breath sounds: Normal breath sounds.   Musculoskeletal:      Right knee: No swelling.      Left knee: No swelling, deformity or effusion. Normal range of motion. Tenderness present over the lateral joint line. Normal pulse.      Instability Tests: Anterior drawer test negative. Posterior drawer test negative.   Skin:     General: Skin is warm.   Neurological:      Mental Status: She is alert.   Psychiatric:         Attention and Perception: Attention normal.         Mood and Affect: Mood normal.         Speech: Speech normal.         Behavior: Behavior normal.            LABS     Lab Results   Component Value Date    CHOL 157 08/23/2019    HDL 56 08/23/2019    TRIG 50 08/23/2019    NA 138 08/25/2020    K 4.2 08/25/2020    BUN 10 08/25/2020    GLU 91 08/25/2020    CA 9.8 08/25/2020    AST 10 08/23/2019    ALT 6 08/23/2019    ALKPHOS 50 08/23/2019    TSH 0.85 06/18/2021    HGBA1C 4.6 05/09/2020         UPDATED IMMUNIZATIONS  Immunization History   Administered Date(s) Administered    COVID-19 mRNA BIVALENT vaccine 12 years and above [employer]) 30 mcg/0.3 mL 09/06/2021    COVID-19 mRNA MONOVALENT vaccine PRIMARY SERIES 12 years and above [employer]) 30 mcg/0.3 mL (DILUTE BEFORE USE) 12/27/2019, 01/17/2020, 07/17/2020    Hepatitis A ( Adult) 04/19/2017    Influenza quad 6 MOS to 64 YRS (Flulaval/Fluarix) 08/23/2020    Influenza vaccine QUADRIVALENT MDCK, 6 months and older (FLUCELVAX), single-dose preservative free, 0.5 mL 09/06/2021    Tdap 04/19/2017    Typhoid Inactivated 04/19/2017         ASSESSMENT and PLAN     1. Encounter to establish care        2. Pelvic floor weakness in female  Ambulatory referral to Physical Therapy      3. Diastasis recti  Ambulatory referral to Physical Therapy      4. Acute pain of left knee  Ambulatory referral to Physical Therapy      5. Bilateral breast lump  Ambulatory referral to Breast Clinic      6. Abnormal mammogram of both  breasts  Ambulatory referral to Breast Clinic      7. Need for hepatitis C screening test  Hepatitis C (HCV) antibody, Total      8. Screening for endocrine, metabolic and immunity disorder  Comprehensive metabolic panel    CBC and differential    Lipid panel        1. Encounter to establish care  Will order labwork as noted for her chronic medical problems.  Previous records reviewed in chart/requested. Reviewed last mammogram, [place], CBC, TSH, etc   Counseled on diet and need for regular exercise.  Continue care with gynecology for pap smear.   Recommended flu vaccination in the fall.     2. Pelvic floor weakness in female/Diastasis recti   Referral for pelvic floor therapy provided for continued management.     3. Acute pain of left knee  Discussed with patient in detail at appt. We reviewed possible etiologies. Recommended RICE therapy including rest, ice application to affected area (20 minutes on, 20 minutes off with cloth between skin and ice), compression with bracing/sleeve and elevation of affected area. Recommended tylenol prn for pain. Referral for PT given for further evaluation and management. Patient advised to contact the office if symptoms worsen, change or persist despite treatment plan.      4. Bilateral breast lump/Abnormal mammogram   Reviewed last mammogram and breast US ordered by previous PCP. Patient requests referral to breast clinic for further evaluation and recommendation on management/surveillance. Referral given per patient request.     5. Need for hepatitis C screening test  CDC currently recommends 1 time hepatitis C screening in all persons >103 years of age,  regardless of exposure history. Education provided to patient at appt. Order for screening provided.      6. Screening for endocrine, metabolic and immunity disorder  Screening labs ordered.     Follow up yearly for annual, sooner prn. Labs prior     Patient expressed understanding and was agreeable to plan of care.      Total  time of 34 minutes spent today including reviewing chart prior to the appointment, exam and explanation of exam findings, treatment, options and recommendations and completion of chart notes following the appointment.      This note was completed using dragon medical speech recognition software. Grammatical errors, random word insertions, pronoun errors, incorrect word insertion, misspellings  and incomplete sentences are occasional consequences of this technology due to software limitations. If there are questions or concerns about the content of this note or information contained within the body of this dictation they should be addressed with the provider for clarification.     Adrian Prince, PA

## 2022-05-15 ENCOUNTER — Encounter (INDEPENDENT_AMBULATORY_CARE_PROVIDER_SITE_OTHER): Payer: Self-pay

## 2022-06-02 ENCOUNTER — Encounter (RURAL_HEALTH_CENTER): Payer: Self-pay | Admitting: Physician Assistant

## 2022-06-02 ENCOUNTER — Ambulatory Visit: Payer: No Typology Code available for payment source | Attending: Physician Assistant | Admitting: Physician Assistant

## 2022-06-02 VITALS — BP 110/67 | HR 100 | Temp 98.0°F | Resp 17 | Ht 66.0 in | Wt 137.5 lb

## 2022-06-02 DIAGNOSIS — M25562 Pain in left knee: Secondary | ICD-10-CM

## 2022-06-02 DIAGNOSIS — Z1329 Encounter for screening for other suspected endocrine disorder: Secondary | ICD-10-CM

## 2022-06-02 DIAGNOSIS — M6208 Separation of muscle (nontraumatic), other site: Secondary | ICD-10-CM

## 2022-06-02 DIAGNOSIS — Z130 Encounter for screening for diseases of the blood and blood-forming organs and certain disorders involving the immune mechanism: Secondary | ICD-10-CM

## 2022-06-02 DIAGNOSIS — Z1159 Encounter for screening for other viral diseases: Secondary | ICD-10-CM

## 2022-06-02 DIAGNOSIS — N6320 Unspecified lump in the left breast, unspecified quadrant: Secondary | ICD-10-CM

## 2022-06-02 DIAGNOSIS — N6310 Unspecified lump in the right breast, unspecified quadrant: Secondary | ICD-10-CM

## 2022-06-02 DIAGNOSIS — N8189 Other female genital prolapse: Secondary | ICD-10-CM

## 2022-06-02 DIAGNOSIS — Z7689 Persons encountering health services in other specified circumstances: Secondary | ICD-10-CM

## 2022-06-02 DIAGNOSIS — Z13228 Encounter for screening for other metabolic disorders: Secondary | ICD-10-CM

## 2022-06-02 DIAGNOSIS — R928 Other abnormal and inconclusive findings on diagnostic imaging of breast: Secondary | ICD-10-CM

## 2022-06-03 ENCOUNTER — Encounter (RURAL_HEALTH_CENTER): Payer: Self-pay

## 2022-06-03 ENCOUNTER — Telehealth (INDEPENDENT_AMBULATORY_CARE_PROVIDER_SITE_OTHER): Payer: Self-pay | Admitting: Surgery

## 2022-06-03 NOTE — Telephone Encounter (Signed)
Patient called stating she was referred to our office for bilateral breast cysts-abnormal mammo. Referral in EPIC-scheduled 06/19/22 @ 11 AM with Dr. Delton See and emailed new patient forms. Imaging and prev bx from [facility] are in EPIC.

## 2022-06-07 ENCOUNTER — Ambulatory Visit: Payer: No Typology Code available for payment source

## 2022-06-14 ENCOUNTER — Encounter (INDEPENDENT_AMBULATORY_CARE_PROVIDER_SITE_OTHER): Payer: Self-pay

## 2022-06-15 ENCOUNTER — Encounter (INDEPENDENT_AMBULATORY_CARE_PROVIDER_SITE_OTHER): Payer: Self-pay

## 2022-06-18 ENCOUNTER — Telehealth (INDEPENDENT_AMBULATORY_CARE_PROVIDER_SITE_OTHER): Payer: Self-pay | Admitting: Surgery

## 2022-06-18 ENCOUNTER — Encounter: Payer: Self-pay | Admitting: Surgery

## 2022-06-18 ENCOUNTER — Ambulatory Visit
Admission: RE | Admit: 2022-06-18 | Discharge: 2022-06-18 | Disposition: A | Discharge status: Home / Self Care | Payer: Self-pay | Source: Ambulatory Visit | Attending: Surgery | Admitting: Surgery

## 2022-06-18 NOTE — Telephone Encounter (Signed)
Called and spoke with patient and confirm appt for October 13th, 2023.

## 2022-06-19 ENCOUNTER — Encounter (INDEPENDENT_AMBULATORY_CARE_PROVIDER_SITE_OTHER): Payer: Self-pay | Admitting: Surgery

## 2022-06-19 ENCOUNTER — Ambulatory Visit (INDEPENDENT_AMBULATORY_CARE_PROVIDER_SITE_OTHER): Payer: No Typology Code available for payment source | Admitting: Surgery

## 2022-06-19 ENCOUNTER — Encounter (INDEPENDENT_AMBULATORY_CARE_PROVIDER_SITE_OTHER): Payer: Self-pay

## 2022-06-19 VITALS — HR 104 | Resp 16 | Ht 66.0 in | Wt 137.6 lb

## 2022-06-19 DIAGNOSIS — N6313 Unspecified lump in the right breast, lower outer quadrant: Secondary | ICD-10-CM

## 2022-06-19 DIAGNOSIS — N6324 Unspecified lump in the left breast, lower inner quadrant: Secondary | ICD-10-CM

## 2022-06-19 DIAGNOSIS — Z803 Family history of malignant neoplasm of breast: Secondary | ICD-10-CM

## 2022-06-19 NOTE — Progress Notes (Signed)
Referral sent for genetic counseling.

## 2022-06-19 NOTE — Patient Instructions (Signed)
Follow up after imaging has been completed.

## 2022-06-19 NOTE — Progress Notes (Signed)
Domagalski,Dorella ISIS  [birth year]      New Patient Evaluation    HISTORY OF PRESENT ILLNESS: [name] is a 31 y.o. premenopausal female who presents for bilateral breast masses.  She is here today with her husband.    She initially noticed breast masses about a year ago and underwent right breast biopsy in January 2022 and left breast biopsy in June 2022.  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  Since becoming pregnant however, she noticed that both masses have increased in size.  Her son was born in June however she has not noticed any real change in the size of the masses.  She does have discomfort in the left breast especially when her breast is engorged.  She is currently breast-feeding.  No history of mastitis.  No skin changes or nipple changes.  She underwent excision of a left breast mass to 30 o'clock position in 2018 which was a fibroadenoma.    Menarche at 20.  She is a G1, P1 and was 31 at the birth of her first child.  No oral contraceptive use.    Family history of breast cancer in father who also had lung cancer.  Paternal aunt with breast and colon cancer, died in her 34s.  Maternal great aunt with breast cancer in her 68s.    PAST MEDICAL HISTORY    No Known Allergies    Current Outpatient Medications   Medication Sig Dispense Refill    Prenatal Vit-DSS-Fe Cbn-FA (PRENATAL AD PO) Take by mouth       No current facility-administered medications for this visit.       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    Group B streptococcal infection in pregnancy        Past Surgical History:   Procedure Laterality Date    BIOPSY, BREAST, TUMOR EXCISION, ULTRASOUND NEEDLE LOCALIZATION Left 02/24/2017    Procedure: BIOPSY, BREAST, TUMOR EXCISION, ULTRASOUND NEEDLE LOCALIZATION;  Surgeon: Tilda Burrow, MD;  Location: [facility];  Service: General;  Laterality: Left;  LEFT BREAST NLOC EXCISIONAL BIOPSY WITH INTRA-OP WIRE LOCALIZATION    BREAST BIOPSY      CESAREAN  SECTION N/A 02/13/2022    Procedure: CESAREAN SECTION;  Surgeon: Luanne Bras, MD;  Location: [facility];  Service: Gynecology;  Laterality: N/A;    CESAREAN SECTION  02/13/2022       Social History     Tobacco Use    Smoking status: Never    Smokeless tobacco: Never   Substance Use Topics    Alcohol use: Not Currently     Comment: 1-2 drinks per month (wine)weekly       Family History   Problem Relation Age of Onset    No known problems Mother     Lung cancer Father     Cancer Father         Lung Cancer    No known problems Sister     No known problems Brother     No known problems Sister     No known problems Sister     No known problems Sister     Breast cancer Paternal Aunt 5    Colon cancer Paternal Aunt     Diabetes Maternal Grandfather     Stroke Maternal Grandfather     Stroke Paternal Grandmother     Hypertension Neg Hx     Ovarian cancer Neg Hx  REVIEW OF SYSTEMS:  All other systems reviewed and are negative.     EXAMINATION:  CONSTITUTIONAL: General appearance normal     VITALS: Pulse (!) 104, resp. rate 16, height 1.676 m (5\' 6" ), weight 62.4 kg (137 lb 9.6 oz), currently breastfeeding.  NECK: The neck and thyroid gland are normal.  RESPIRATORY: The respiratory effort is normal. The lungs are clear to auscultation bilaterally.  CARDIOVASCULAR: The heart is regular rate and rhythm without murmurs, rubs, or gallops. The carotid arteries have no bruits.    Physical Exam  Constitutional:       Appearance: Normal appearance. She is not ill-appearing.   Chest:   Breasts:     Right: Mass present. No swelling, bleeding, inverted nipple, nipple discharge, skin change or tenderness.      Left: Mass present. No swelling, bleeding, inverted nipple, nipple discharge, skin change or tenderness.       Lymphadenopathy:      Upper Body:      Right upper body: No supraclavicular or axillary adenopathy.      Left upper body: No supraclavicular or axillary adenopathy.   Neurological:      Mental Status:  She is alert.         IMAGING: Imaging was personally reviewed and interpreted independently.  Bilateral mammogram was completed on 04/23/2022 at [facility].  This demonstrates extremely dense breast tissue compatible with history of breast-feeding.  Right breast biopsy clip noted 6 o'clock position.  No suspicious change.  Left breast central distortion adjacent to a scar marker may be postsurgical scarring.  Biopsy clip noted at 7:00 mass.  Lateral biopsy clip also seen.    Targeted ultrasound of bilateral breast demonstrates right breast 6 o'clock position, a mass measuring 2.4 x 2.6 x 1.9 cm, previously measuring 1.9 x 0.9 x 1.6 cm.  Left breast 7 o'clock position 1 cm from the nipple, there is a complex cystic mass measuring 4.5 x 4.2 x 3.0 cm previously measuring 2.4 x 1.4 x 3.1 cm.  No suspicious finding in retroareolar breast.  BI-RADS 3    CORRESPONDENCE: Office note from Debbora Presto, PA was reviewed    IMPRESSION:  Encounter Diagnoses   Name Primary?    Mass of lower inner quadrant of left breast Yes    Mass of lower outer quadrant of right breast     Family history of breast cancer    Right breast fibroadenoma, 6 o'clock position  Left breast fibroadenoma 7 o'clock position    PLAN:   We discussed that both fibroadenomas have increased in size likely secondary to breast-feeding and increased hormones that are present.  Recommend repeat bilateral ultrasound in February 2024 for surveillance.  We discussed that once she has completed breast-feeding, we could proceed with excision of both masses if they are giving her discomfort.  I would recommend against excision at this time as there is a higher risk of complications such as milk fistula.  Return sooner if any change or concern.  Follow-up in February 2024 after imaging has been completed.  Refer for genetic counseling given history of father with breast cancer.    Additional Orders:  Orders Placed This Encounter   Procedures    Mammo Diagnostic  w/Tomo Bilat    US Breast Bilateral Complete       Leonie Green, MD, [facility]   [facility]  [phone]     CC: Webb Laws, PA

## 2022-06-29 ENCOUNTER — Encounter (RURAL_HEALTH_CENTER): Payer: Self-pay

## 2022-07-10 NOTE — Progress Notes (Signed)
GENETIC COUNSELING NOTE    Name: [name]  MRN: [id number]  DOB: Jan 23, 1991  DOV: 07/10/2022 (Telephone audio only visit)  Referring Provider:  Johnna Acosta, MD    Reason for Referral  I met with [name] today via phone call for genetic counseling and risk assessment. [name] is a 31 y.o. female with a family history of breast cancer. The patient is unaccompanied.    [name] past medical history was reviewed, including information from the electronic medical record as well as additional information provided by the patient.    Pertinent Family History  [name] reports African and European ancestry and denies any Ashkenazi Jewish ancestry. Family history is otherwise negative for cancer. Pertinent positives include:  Father, deceased, with breast cancer and lung cancer in his 66s  Paternal aunt, deceased, with breast cancer at 32 yo  Maternal great aunt with breast cancer at 32 yo  Maternal great grandmother with breast cancer        Cancer History  No personal history of cancer.    Cancer Surveillance  Breast screening: Most recent mammogram in August 2023. Results showed extremely dense breast tissue. At that time she also had a breast ultrasound which showed a mass that was previously bipsied. She initially noticed breast masses about a year ago and underwent right breast biopsy in January of 2022 and left breast biopsy in June of 2022. 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 She additionally underwent excision of a left brast mass in 2018 which was also a fibroadenoma.    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    Group B streptococcal infection in pregnancy        Surgical History  Past Surgical History:   Procedure Laterality Date    BIOPSY, BREAST, TUMOR EXCISION, ULTRASOUND NEEDLE LOCALIZATION Left 02/24/2017    Procedure: BIOPSY, BREAST, TUMOR EXCISION, ULTRASOUND NEEDLE LOCALIZATION;  Surgeon: Tilda Burrow, MD;  Location:  [facility];  Service: General;  Laterality: Left;  LEFT BREAST NLOC EXCISIONAL BIOPSY WITH INTRA-OP WIRE LOCALIZATION    BREAST BIOPSY      CESAREAN SECTION N/A 02/13/2022    Procedure: CESAREAN SECTION;  Surgeon: Luanne Bras, MD;  Location: [facility];  Service: Gynecology;  Laterality: N/A;    CESAREAN SECTION  02/13/2022     Reproductive History  Age of Menarche: 4  Age at first live birth: 80  Age of Menopause: Premenopausal  HRT: None    Exposures  Social History     Tobacco Use   Smoking Status Never   Smokeless Tobacco Never     Assessment:  [name] family history is suspicious for an inherited cancer susceptibility. They meet the following NCCN criteria (Genetic/Familial High-Risk Assessment: Breast, Ovarian, and Pancreatic, v2.2024):  Individual unaffected with breast cancer with a first or second degree relative meeting criteria  Female breast cancer    Given this elevated risk to carry a gene variant that predisposes to cancer, genetic testing for [name] is indicated and was offered today. If [name] were found to carry a gene variant that increases the risk for cancer, it will change her medical management.    Discussion  The genetics of breast cancer were reviewed. Approximately 5-10% of all breast cancer diagnoses have been associated with a strong inherited susceptibility involving a single germline gene variant resulting in a significant increase in risk for cancer. About 10-20% of all breast  cancer diagnoses have been associated with a familial susceptibility, but likely involve the interaction of multiple genes along with environmental factors. The remaining 70-85% of breast cancers are thought to be sporadic with no strong hereditary component.     Families with an inherited susceptibility to breast cancer typically show a pattern of multiple diagnoses in multiple generations, younger ages of diagnosis, female breast cancer, and other associated malignancies (such  as ovarian carcinoma). Approximately 25-50% of families with an inherited susceptibility to breast cancer can be accounted for by detectable germline variants in two genes: BRCA1 and BRCA2. Women who carry a germline BRCA1 or BRCA2 variant have a > 60% lifetime risk of developing breast cancer, and an increased chance to develop a second primary breast cancer following their initial diagnosis. Women who carry germline BRCA1/2 pathogenic variants also have an increased chance to develop ovarian cancer (11-40% lifetime risk). In recent years, there have been numerous other genes that have been linked to an increased hereditary risk for breast cancer that are now available for clinical testing. These other genes now known to be associated with breast cancer vary in the degree to which they increase risk, and more research is ongoing to better understand the relationship between these gene variants and the level of cancer risk.     We reviewed the risks, benefits, and limitations of genetic testing for hereditary cancer syndromes. Genetic testing can help clarify an affected individual's risk of developing a new or second primary, which is important when deciding on the best screening protocol for that patient. The best practices will vary by gene and by patient, but risk-reducing options include more frequent surveillance, risk-reducing surgeries, and some chemopreventive medications. Most of these hereditary cancer syndromes are inherited in an autosomal dominant manner, so genetic testing can also allow for "downstream" testing of at-risk family members. Early identification is critical in patients with hereditary cancer syndromes, as some cancer risks can be largely mitigated through appropriate surveillance. We reviewed the rationale behind postponing genetic testing for adult-onset conditions in children, although exceptions would be made in situations where testing results would alter the child's  healthcare.    We discussed performing a more expanded panel for additional genes related to hereditary cancer syndromes. We discussed some of the moderate risk breast cancer genes (ATM, CHEK2, PALB2) and that many expanded breast cancer panels include other syndromes, such as polyposis syndromes. [name] is aware that some of the genes on expanded panels impart only low to moderate cancer risks, may impart cancer risks for which she is currently unaware (colorectal, pancreatic, etc.), and that altering medical management may not be able to ameliorate all of these risks.    We reviewed the risks, benefits, and limitations of genetic testing for hereditary cancer syndromes. It is always best to initiate cancer screening with the individual in the family that is most likely to carry a pathogenic mutation - in this case, [name] is an appropriate proband. We discussed the implications of a positive result, a negative result, and a variant of uncertain significance (VUS) in her.    A positive result would mean that a pathogenic, or disease-causing, mutation had been identified. This would confirm a diagnosis of a hereditary cancer syndrome in [name]. In this case we would recommend that [name] follow the appropriate medical management guidelines for that syndrome. We would also recommend that other family members be tested to determine whether they are also at an increased risk for certain cancers.  A negative result would mean that no pathogenic mutations were identified. A negative result cannot definitively rule out a hereditary cancer syndrome, and heightened surveillance may still be necessary.   A VUS result would mean that a mutation was identified but we do not currently have enough information to determine the significance of that particular gene change with certainty. VUS results may be reclassified as either pathogenic or benign in the future, so it is possible that our understanding of  a VUS result and the clinical implications of that result will change. Testing of unaffected family members or changing medical management recommendations is typically not recommended in the setting of a VUS.    These gene variants are mostly inherited in an autosomal dominant pattern, meaning that a parent who carries one of these gene mutations has a 50% chance to pass it down to each of their children. We reviewed the fact that having a hereditary cancer syndrome does not guarantee that you will develop cancer. We reviewed the rationale behind postponing genetic testing for adult-onset conditions in children, although exceptions would be made in situations where testing results would alter the child's healthcare. We reviewed her risk management options, which include heightened surveillance protocols, prophylactic surgeries like mastectomy or oophorectomies, and risk-reducing chemoprevention. We reviewed the importance of identifying the familial mutation in the appropriate proband prior to pursuing informative testing in unaffected, at-risk relatives.    We discussed the need to check prior authorization requirements before insurance billing as well as the self-pay pricing option of $250.    Due to the significant medical implications associated with the inheritance of one of these cancer-predisposing variants, we discussed the legal landscape with regards to protection against insurance discrimination.  The Genetic Information Nondiscrimination Act of 2008 (GINA) is a Freight forwarder that generally protects individuals from genetic discrimination in health insurance and employment. We discussed that this legal protection does not extend to life and long term care insurance.      Following our discussion of the risks, benefits, and limitations of genetic testing, [name] expressed understanding of all of the above.     Plan  Patient meets criteria for genetic testing for hereditary cancer predisposition based  on her family history of breast cancer.   Patient declines to proceed with genetic testing today as she would like to check into her current life insurance policy before proceeding. Patient is aware she can contact our office should she decide to proceed with testing in the future    36 minutes of telephone time with the patient    Harlene Ramus, [facility]  Licensed Financial risk analyst Program    Copy to: Johnna Acosta, MD; Hosie Poisson, [place]

## 2022-07-15 ENCOUNTER — Encounter (INDEPENDENT_AMBULATORY_CARE_PROVIDER_SITE_OTHER): Payer: Self-pay

## 2022-07-16 ENCOUNTER — Encounter (INDEPENDENT_AMBULATORY_CARE_PROVIDER_SITE_OTHER): Payer: Self-pay

## 2022-08-14 ENCOUNTER — Encounter (INDEPENDENT_AMBULATORY_CARE_PROVIDER_SITE_OTHER): Payer: Self-pay

## 2022-08-15 ENCOUNTER — Encounter (INDEPENDENT_AMBULATORY_CARE_PROVIDER_SITE_OTHER): Payer: Self-pay

## 2022-09-15 ENCOUNTER — Encounter (INDEPENDENT_AMBULATORY_CARE_PROVIDER_SITE_OTHER): Payer: Self-pay

## 2022-10-26 ENCOUNTER — Ambulatory Visit: Payer: No Typology Code available for payment source

## 2023-06-03 ENCOUNTER — Ambulatory Visit (RURAL_HEALTH_CENTER): Payer: Self-pay | Admitting: Physician Assistant
